# Patient Record
Sex: Female | Born: 1960 | Race: White | Hispanic: No | Marital: Married | Smoking: Never smoker
Health system: Southern US, Community
[De-identification: ages and names within clinical notes are randomized; demographics above are authoritative.]

## PROBLEM LIST (undated history)

## (undated) DIAGNOSIS — C801 Malignant (primary) neoplasm, unspecified: Secondary | ICD-10-CM

## (undated) DIAGNOSIS — N946 Dysmenorrhea, unspecified: Secondary | ICD-10-CM

## (undated) DIAGNOSIS — M722 Plantar fascial fibromatosis: Secondary | ICD-10-CM

## (undated) DIAGNOSIS — K219 Gastro-esophageal reflux disease without esophagitis: Secondary | ICD-10-CM

## (undated) DIAGNOSIS — K589 Irritable bowel syndrome without diarrhea: Secondary | ICD-10-CM

## (undated) DIAGNOSIS — IMO0002 Reserved for concepts with insufficient information to code with codable children: Secondary | ICD-10-CM

## (undated) DIAGNOSIS — M25569 Pain in unspecified knee: Secondary | ICD-10-CM

## (undated) DIAGNOSIS — E669 Obesity, unspecified: Secondary | ICD-10-CM

## (undated) DIAGNOSIS — Z8669 Personal history of other diseases of the nervous system and sense organs: Secondary | ICD-10-CM

## (undated) DIAGNOSIS — E7800 Pure hypercholesterolemia, unspecified: Secondary | ICD-10-CM

## (undated) DIAGNOSIS — J019 Acute sinusitis, unspecified: Secondary | ICD-10-CM

## (undated) DIAGNOSIS — L851 Acquired keratosis [keratoderma] palmaris et plantaris: Secondary | ICD-10-CM

## (undated) DIAGNOSIS — E785 Hyperlipidemia, unspecified: Secondary | ICD-10-CM

## (undated) HISTORY — DX: Pain in unspecified knee: M25.569

## (undated) HISTORY — DX: Obesity, unspecified: E66.9

## (undated) HISTORY — DX: Acquired keratosis (keratoderma) palmaris et plantaris: L85.1

## (undated) HISTORY — PX: COLONOSCOPY: SHX174

## (undated) HISTORY — DX: Pure hypercholesterolemia, unspecified: E78.00

## (undated) HISTORY — DX: Personal history of other diseases of the nervous system and sense organs: Z86.69

## (undated) HISTORY — DX: Plantar fascial fibromatosis: M72.2

## (undated) HISTORY — DX: Hyperlipidemia, unspecified: E78.5

## (undated) HISTORY — DX: Gastro-esophageal reflux disease without esophagitis: K21.9

## (undated) HISTORY — DX: Reserved for concepts with insufficient information to code with codable children: IMO0002

## (undated) HISTORY — DX: Dysmenorrhea, unspecified: N94.6

## (undated) HISTORY — DX: Irritable bowel syndrome, unspecified: K58.9

## (undated) HISTORY — DX: Acute sinusitis, unspecified: J01.90

---

## 1999-12-01 ENCOUNTER — Encounter: Admission: RE | Admit: 1999-12-01 | Discharge: 1999-12-02 | Payer: Self-pay | Admitting: Family Medicine

## 1999-12-28 ENCOUNTER — Other Ambulatory Visit: Admission: RE | Admit: 1999-12-28 | Discharge: 1999-12-28 | Payer: Self-pay | Admitting: Obstetrics and Gynecology

## 2001-02-08 ENCOUNTER — Other Ambulatory Visit: Admission: RE | Admit: 2001-02-08 | Discharge: 2001-02-08 | Payer: Self-pay | Admitting: Obstetrics and Gynecology

## 2002-02-15 ENCOUNTER — Other Ambulatory Visit: Admission: RE | Admit: 2002-02-15 | Discharge: 2002-02-15 | Payer: Self-pay | Admitting: Obstetrics and Gynecology

## 2003-03-15 ENCOUNTER — Other Ambulatory Visit: Admission: RE | Admit: 2003-03-15 | Discharge: 2003-03-15 | Payer: Self-pay | Admitting: Obstetrics and Gynecology

## 2004-05-18 ENCOUNTER — Other Ambulatory Visit: Admission: RE | Admit: 2004-05-18 | Discharge: 2004-05-18 | Payer: Self-pay | Admitting: Obstetrics and Gynecology

## 2006-03-23 ENCOUNTER — Ambulatory Visit: Payer: Self-pay | Admitting: Family Medicine

## 2006-04-18 ENCOUNTER — Other Ambulatory Visit: Admission: RE | Admit: 2006-04-18 | Discharge: 2006-04-18 | Payer: Self-pay | Admitting: Family Medicine

## 2006-04-18 ENCOUNTER — Ambulatory Visit: Payer: Self-pay | Admitting: Family Medicine

## 2006-04-18 ENCOUNTER — Encounter: Payer: Self-pay | Admitting: Family Medicine

## 2006-04-18 LAB — CONVERTED CEMR LAB: Pap Smear: NORMAL

## 2006-06-15 ENCOUNTER — Ambulatory Visit: Payer: Self-pay | Admitting: Family Medicine

## 2006-07-28 ENCOUNTER — Ambulatory Visit: Payer: Self-pay | Admitting: Internal Medicine

## 2006-10-13 ENCOUNTER — Encounter: Payer: Self-pay | Admitting: Family Medicine

## 2006-10-13 DIAGNOSIS — K219 Gastro-esophageal reflux disease without esophagitis: Secondary | ICD-10-CM

## 2006-10-13 DIAGNOSIS — J309 Allergic rhinitis, unspecified: Secondary | ICD-10-CM | POA: Insufficient documentation

## 2006-10-13 DIAGNOSIS — N946 Dysmenorrhea, unspecified: Secondary | ICD-10-CM

## 2006-10-13 DIAGNOSIS — Z87898 Personal history of other specified conditions: Secondary | ICD-10-CM

## 2006-10-13 DIAGNOSIS — K589 Irritable bowel syndrome without diarrhea: Secondary | ICD-10-CM

## 2006-10-13 DIAGNOSIS — E7800 Pure hypercholesterolemia, unspecified: Secondary | ICD-10-CM

## 2006-10-14 ENCOUNTER — Ambulatory Visit: Payer: Self-pay | Admitting: Family Medicine

## 2006-11-11 ENCOUNTER — Telehealth (INDEPENDENT_AMBULATORY_CARE_PROVIDER_SITE_OTHER): Payer: Self-pay | Admitting: *Deleted

## 2006-11-16 ENCOUNTER — Encounter: Payer: Self-pay | Admitting: Family Medicine

## 2007-05-25 ENCOUNTER — Encounter: Payer: Self-pay | Admitting: Family Medicine

## 2007-05-25 ENCOUNTER — Other Ambulatory Visit: Admission: RE | Admit: 2007-05-25 | Discharge: 2007-05-25 | Payer: Self-pay | Admitting: Family Medicine

## 2007-05-25 ENCOUNTER — Ambulatory Visit: Payer: Self-pay | Admitting: Family Medicine

## 2007-05-25 DIAGNOSIS — L851 Acquired keratosis [keratoderma] palmaris et plantaris: Secondary | ICD-10-CM | POA: Insufficient documentation

## 2007-05-29 LAB — CONVERTED CEMR LAB
Albumin: 3.9 g/dL (ref 3.5–5.2)
Basophils Absolute: 0 10*3/uL (ref 0.0–0.1)
Cholesterol: 216 mg/dL (ref 0–200)
Creatinine, Ser: 0.9 mg/dL (ref 0.4–1.2)
Direct LDL: 155.8 mg/dL
GFR calc Af Amer: 87 mL/min
HCT: 39.4 % (ref 36.0–46.0)
Hemoglobin: 13.9 g/dL (ref 12.0–15.0)
Lymphocytes Relative: 37.5 % (ref 12.0–46.0)
MCHC: 35.2 g/dL (ref 30.0–36.0)
MCV: 95.5 fL (ref 78.0–100.0)
Monocytes Absolute: 0.5 10*3/uL (ref 0.2–0.7)
Monocytes Relative: 6.1 % (ref 3.0–11.0)
Neutro Abs: 4.3 10*3/uL (ref 1.4–7.7)
Neutrophils Relative %: 55.5 % (ref 43.0–77.0)
Potassium: 4.3 meq/L (ref 3.5–5.1)
RDW: 12.3 % (ref 11.5–14.6)
Sodium: 137 meq/L (ref 135–145)
TSH: 1.9 microintl units/mL (ref 0.35–5.50)
Total Bilirubin: 1 mg/dL (ref 0.3–1.2)
Total CHOL/HDL Ratio: 4.6
Total Protein: 7 g/dL (ref 6.0–8.3)

## 2007-05-31 ENCOUNTER — Encounter (INDEPENDENT_AMBULATORY_CARE_PROVIDER_SITE_OTHER): Payer: Self-pay | Admitting: *Deleted

## 2007-05-31 LAB — CONVERTED CEMR LAB: Pap Smear: NORMAL

## 2007-06-06 ENCOUNTER — Telehealth (INDEPENDENT_AMBULATORY_CARE_PROVIDER_SITE_OTHER): Payer: Self-pay | Admitting: *Deleted

## 2007-06-07 ENCOUNTER — Encounter: Payer: Self-pay | Admitting: Family Medicine

## 2007-06-23 ENCOUNTER — Encounter: Payer: Self-pay | Admitting: Family Medicine

## 2007-07-31 ENCOUNTER — Encounter: Payer: Self-pay | Admitting: Family Medicine

## 2007-08-28 ENCOUNTER — Ambulatory Visit: Payer: Self-pay | Admitting: Family Medicine

## 2007-08-29 LAB — CONVERTED CEMR LAB
ALT: 18 units/L (ref 0–35)
VLDL: 30 mg/dL (ref 0–40)

## 2007-12-28 ENCOUNTER — Ambulatory Visit: Payer: Self-pay | Admitting: Family Medicine

## 2007-12-28 DIAGNOSIS — R079 Chest pain, unspecified: Secondary | ICD-10-CM

## 2008-02-01 ENCOUNTER — Encounter: Payer: Self-pay | Admitting: Family Medicine

## 2008-03-04 ENCOUNTER — Ambulatory Visit: Payer: Self-pay | Admitting: Unknown Physician Specialty

## 2008-03-04 ENCOUNTER — Encounter: Payer: Self-pay | Admitting: Family Medicine

## 2008-03-12 ENCOUNTER — Encounter: Payer: Self-pay | Admitting: Family Medicine

## 2008-03-25 ENCOUNTER — Telehealth: Payer: Self-pay | Admitting: Family Medicine

## 2008-03-26 ENCOUNTER — Telehealth: Payer: Self-pay | Admitting: Family Medicine

## 2008-04-22 ENCOUNTER — Telehealth: Payer: Self-pay | Admitting: Family Medicine

## 2008-04-23 ENCOUNTER — Telehealth (INDEPENDENT_AMBULATORY_CARE_PROVIDER_SITE_OTHER): Payer: Self-pay | Admitting: *Deleted

## 2008-04-23 ENCOUNTER — Ambulatory Visit: Payer: Self-pay | Admitting: Family Medicine

## 2008-05-28 ENCOUNTER — Ambulatory Visit: Payer: Self-pay | Admitting: Family Medicine

## 2008-05-28 ENCOUNTER — Other Ambulatory Visit: Admission: RE | Admit: 2008-05-28 | Discharge: 2008-05-28 | Payer: Self-pay | Admitting: Family Medicine

## 2008-05-28 ENCOUNTER — Encounter: Payer: Self-pay | Admitting: Family Medicine

## 2008-05-28 LAB — HM PAP SMEAR

## 2008-05-29 LAB — CONVERTED CEMR LAB
Basophils Absolute: 0 10*3/uL (ref 0.0–0.1)
Bilirubin, Direct: 0.1 mg/dL (ref 0.0–0.3)
Calcium: 8.8 mg/dL (ref 8.4–10.5)
Cholesterol: 161 mg/dL (ref 0–200)
Eosinophils Absolute: 0.1 10*3/uL (ref 0.0–0.7)
GFR calc Af Amer: 99 mL/min
Glucose, Bld: 90 mg/dL (ref 70–99)
HCT: 40.4 % (ref 36.0–46.0)
Hemoglobin: 13.9 g/dL (ref 12.0–15.0)
MCHC: 34.5 g/dL (ref 30.0–36.0)
MCV: 98 fL (ref 78.0–100.0)
Monocytes Absolute: 0.5 10*3/uL (ref 0.1–1.0)
Neutro Abs: 5.5 10*3/uL (ref 1.4–7.7)
RDW: 12.2 % (ref 11.5–14.6)
Sodium: 141 meq/L (ref 135–145)
Total Bilirubin: 0.5 mg/dL (ref 0.3–1.2)
Total Protein: 6.9 g/dL (ref 6.0–8.3)
Triglycerides: 127 mg/dL (ref 0–149)

## 2008-07-31 ENCOUNTER — Encounter: Payer: Self-pay | Admitting: Family Medicine

## 2008-08-08 ENCOUNTER — Encounter: Payer: Self-pay | Admitting: Family Medicine

## 2009-04-14 ENCOUNTER — Encounter (INDEPENDENT_AMBULATORY_CARE_PROVIDER_SITE_OTHER): Payer: Self-pay | Admitting: *Deleted

## 2009-06-17 ENCOUNTER — Ambulatory Visit: Payer: Self-pay | Admitting: Family Medicine

## 2009-06-17 DIAGNOSIS — M25569 Pain in unspecified knee: Secondary | ICD-10-CM

## 2009-06-20 ENCOUNTER — Encounter: Payer: Self-pay | Admitting: Family Medicine

## 2009-06-20 ENCOUNTER — Encounter (INDEPENDENT_AMBULATORY_CARE_PROVIDER_SITE_OTHER): Payer: Self-pay | Admitting: *Deleted

## 2009-06-20 LAB — CONVERTED CEMR LAB
Alkaline Phosphatase: 41 units/L (ref 39–117)
Basophils Absolute: 0 10*3/uL (ref 0.0–0.1)
Basophils Relative: 0.7 % (ref 0.0–3.0)
Bilirubin, Direct: 0 mg/dL (ref 0.0–0.3)
CO2: 28 meq/L (ref 19–32)
Calcium: 9.1 mg/dL (ref 8.4–10.5)
Cholesterol: 205 mg/dL — ABNORMAL HIGH (ref 0–200)
Creatinine, Ser: 0.7 mg/dL (ref 0.4–1.2)
Eosinophils Absolute: 0.1 10*3/uL (ref 0.0–0.7)
Lymphocytes Relative: 41.1 % (ref 12.0–46.0)
MCHC: 33.5 g/dL (ref 30.0–36.0)
Neutrophils Relative %: 51.3 % (ref 43.0–77.0)
Platelets: 338 10*3/uL (ref 150.0–400.0)
RBC: 4.1 M/uL (ref 3.87–5.11)
RDW: 12.7 % (ref 11.5–14.6)
Total Bilirubin: 0.9 mg/dL (ref 0.3–1.2)
Total CHOL/HDL Ratio: 4
Triglycerides: 230 mg/dL — ABNORMAL HIGH (ref 0.0–149.0)

## 2009-08-01 ENCOUNTER — Encounter: Payer: Self-pay | Admitting: Family Medicine

## 2009-08-06 ENCOUNTER — Encounter (INDEPENDENT_AMBULATORY_CARE_PROVIDER_SITE_OTHER): Payer: Self-pay | Admitting: *Deleted

## 2009-08-28 ENCOUNTER — Ambulatory Visit: Payer: Self-pay | Admitting: Family Medicine

## 2009-08-28 DIAGNOSIS — M722 Plantar fascial fibromatosis: Secondary | ICD-10-CM

## 2009-09-19 ENCOUNTER — Ambulatory Visit: Payer: Self-pay | Admitting: Family Medicine

## 2009-09-30 ENCOUNTER — Encounter: Payer: Self-pay | Admitting: Family Medicine

## 2009-09-30 LAB — CONVERTED CEMR LAB
Cholesterol: 207 mg/dL — ABNORMAL HIGH (ref 0–200)
Total CHOL/HDL Ratio: 4
Triglycerides: 134 mg/dL (ref 0.0–149.0)
VLDL: 26.8 mg/dL (ref 0.0–40.0)

## 2009-11-07 ENCOUNTER — Encounter (INDEPENDENT_AMBULATORY_CARE_PROVIDER_SITE_OTHER): Payer: Self-pay | Admitting: *Deleted

## 2009-11-24 ENCOUNTER — Ambulatory Visit: Payer: Self-pay | Admitting: Family Medicine

## 2009-11-24 DIAGNOSIS — J019 Acute sinusitis, unspecified: Secondary | ICD-10-CM

## 2010-07-14 NOTE — Letter (Signed)
Summary: Results Follow up Letter  [place] at [facility]  [address] [place], [place] [address]   Phone: [phone]  Fax: [phone]    08/06/2009 MRN: [id number]    [facility] [address] [place], [place]  [address]    Dear Ms. Thamas Jaegers,  The following are the results of your recent test(s):  Test         Result    Pap Smear:        Normal _____  Not Normal _____ Comments: ______________________________________________________ Cholesterol: LDL(Bad cholesterol):         Your goal is less than:         HDL (Good cholesterol):       Your goal is more than: Comments:  ______________________________________________________ Mammogram:        Normal __X___  Not Normal _____ Comments:  Yearly follow up is recommended.   ___________________________________________________________________ Hemoccult:        Normal _____  Not normal _______ Comments:    _____________________________________________________________________ Other Tests:    We routinely do not discuss normal results over the telephone.  If you desire a copy of the results, or you have any questions about this information we can discuss them at your next office visit.   Sincerely,    Marne A. Milinda Antis, M.D.  MAT:lsf

## 2010-07-14 NOTE — Assessment & Plan Note (Signed)
Summary: HEELS ON FEET AND KNEES/DLO   Vital Signs:  Patient profile:   50 year old female Height:      66.75 inches Weight:      181.8 pounds BMI:     28.79 Temp:     98.2 degrees F oral Pulse rate:   72 / minute Pulse rhythm:   regular BP sitting:   130 / 78  (left arm) Cuff size:   regular  Vitals Entered By: Benny Lennert CMA Duncan Dull) (August 28, 2009 10:50 AM)  History of Present Illness: Chief complaint heels on feet and knees Left worse then right center of heel worse ober last month had x-ray on knees and still in pain  50 year old with B knee pain and B heel pain:  Has some longstanding back issues, had a series of epidural injections in her back and sitting here and running into her feet and heel.  Has a known herniated disk.   B knees: started around six months ago, having some crepitus. Unable to move them that well - feels better with holding tight. Burns and hurtin. Pain going p and down sairs. Some catching.   Heels: started a couple of months ago. About a year ago bought some shapeup shoes. Hurts with walking.  Pain in morning and after rising from a seated position.  Allergies: 1)  ! Nsaids 2)  ! Prilosec 3)  ! * Allegra  Past History:  Past medical, surgical, family and social histories (including risk factors) reviewed, and no changes noted (except as noted below).  Past Medical History: Reviewed history from 05/28/2008 and no changes required. Allergic rhinitis GERD/esophagitis  IBS hyperlipidemia obesity  GI -- Dr Markham Jordan   Past Surgical History: Reviewed history from 03/12/2008 and no changes required. EGD - 9/09 gastritis, grade one esophagitis  colonoscopy 9/09 nl   Family History: Reviewed history from 05/28/2008 and no changes required. mother MI, HTN- died at 53 father lung cancer- smoker, esoph stricture (?GERD) brother HTN sister died in 11/18/22 MAC (lung disease)  Social History: Reviewed history from 05/28/2008 and no changes  required. Never Smoked no alcohol   Review of Systems       REVIEW OF SYSTEMS  GEN: No systemic complaints, no fevers, chills, sweats, or other acute illnesses MSK: Detailed in the HPI GI: tolerating PO intake without difficulty Otherwise the pertinent positives of the ROS are noted above.    Physical Exam  General:  GEN: Well-developed,well-nourished,in no acute distress; alert,appropriate and cooperative throughout examination HEENT: Normocephalic and atraumatic without obvious abnormalities. No apparent alopecia or balding. Ears, externally no deformities PULM: Breathing comfortably in no respiratory distress EXT: No clubbing, cyanosis, or edema PSYCH: Normally interactive. Cooperative during the interview. Pleasant. Friendly and conversant. Not anxious or depressed appearing. Normal, full affect.  Msk:  Gait: Normal heel toe pattern ROM: WNL Effusion: neg Echymosis or edema: none Patellar tendon NT Painful PLICA: neg Grind: pain with inferior and superior polar compression Medial and lateral patellar facet loading: painful NT medial and lateral joint lines Mcmurray's neg Flexion-pinch neg Varus and valgus stress: stable Lachman: neg Ant and Post drawer: neg Hip abduction, IR, ER: WNL Hip flexion str: 5/5 Hip abd: 5/5 Quad: 5/5 VMO atrophy: mild Hamstring concentric and eccentric: 5/5   Additional Exam:  B feet Echymosis: no Edema: no ROM: full LE B Gait: heel toe, non-antalgic MT pain: no Callus pattern: none Lateral Mall: NT Medial Mall: NT Talus: NT Navicular: NT Calcaneous: NT Metatarsals: NT  5th MT: NT Phalanges: NT Achilles: NT Plantar Fascia: tender, medial along PF. Pain with forced dorsi Fat Pad: NT Peroneals: NT Post Tib: NT Great Toe: Nml motion Ant Drawer: neg Sensation: intact    Impression & Recommendations:  Problem # 1:  PATELLO-FEMORAL SYNDROME (ICD-719.46) Assessment New Patient instructed regarding patellofemoral pain  syndrome. Reviewed a strengthening program for quadriceps including squats, mini-squats, lunges, leg lifts 3 ways, as well as hip abductor strengthening with lifts of leg lying on hip. Reviewed several proprioceptive exerices for the knee.  I think this is all PF joint. Classic presentation and exam.  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 Reviewed footwear. Rigid soles have been shown to help with PF. Night splints can help. Reviewed rehab of stretching and calf raises.  The patient would be an ideal candidate for custom orthotics, which were shown in a 2008 Cochrane Review to be beneficial in PF -  OTC orthotics for now Could benefit from a corticosteroid injection if conservative treatment fails.  Heel cups  Complete Medication List: 1)  Necon 1/35 (28) 1-35 Mg-mcg Tabs (Norethindrone-eth estradiol) .... Take one by mouth daily as directed continuously  Patient Instructions: 1)  Please read handouts from AAOSM and American Academy of Foot [employer] Ankle Surgeons on Plantar Fascitis. 2)  STRETCHING and Strengthening program critically important. 3)  Strengthening on foot and calf muscles as seen in handout. 4)  Calf raises, 2 legged, then 1 legged. 5)  Foot massage with tennis ball. 6)  Ice massage. 7)  NEEDS TO BE DONE EVERY DAY 8)  Recommended over the counter insoles. (Spenco or Hapad) 9)  A rigid shoe with good arch support helps: Dansko (great), Randel Pigg, Merrell 10)  No easily bendable shoes.   11)  Patellofemoral Syndrome Rehab 12)  Isometric contractions of thigh - 10 x 10 secs 13)  3 way straight leg raises - build to 3 sets of 30 and then add weights 14)  begin with no weight. When 3 x 30 reached, Add 2 lb. ankle weight. Increase to 3,4,5,6 when 3x30 achieved. 15)  Drop squats - limit to 45 deg, 3x15 16)  Modified lunge - running position, 3x15 17)  Seated quad extensions,  3x15, add ankle weights 18)  Step downs, 3x15 with body weight slowly on downward phase 19)  Standing cone touches, 3x/day each leg   Current Allergies (reviewed today): ! NSAIDS ! PRILOSEC ! Joyce Copa

## 2010-07-14 NOTE — Letter (Signed)
Summary: [place] No Show Letter  [place] at [facility]  [address] [place], [place] [address]   Phone: [phone]  Fax: [phone]    11/07/2009 MRN: [id number]  ROSALIA MCAVOY [address] [place], [place]  [address]   Dear Ms. Thamas Jaegers,   Our records indicate that you missed your scheduled appointment with ___lab__________________ on __5.27.11__________.  Please contact this office to reschedule your appointment as soon as possible.  It is important that you keep your scheduled appointments with your physician, so we can provide you the best care possible.  Please be advised that there may be a charge for "no show" appointments.    Sincerely,   [place] at [facility]

## 2010-07-14 NOTE — Assessment & Plan Note (Signed)
Summary: ST,EARS,CHEST CONGESTION/CLE   Vital Signs:  Patient profile:   50 year old female Height:      66.75 inches Weight:      183.50 pounds BMI:     29.06 O2 Sat:      97 % on Room air Temp:     98.7 degrees F oral Pulse rate:   88 / minute Pulse rhythm:   regular Resp:     20 per minute BP sitting:   124 / 76  (left arm) Cuff size:   regular  Vitals Entered By: Lewanda Rife LPN (November 24, 2009 2:12 PM)  O2 Flow:  Room air CC: head and chest congestion, dizzy, h/a, very sorethroat with trouble swallowing, both ears ache and dry cough   History of Present Illness: thursday started having symptoms after a vacation  got worse over weekend  ears hurt and throat and head  chest is sore lots of coughing some hoarseness not much phlegm- but hurts to cough  no sick contacts   ? if it started with allergies  sinuses hurt a lot  mucous from nose is mainly clear  ? fever -- sometimes feels hot- no chills or aches   rapid strep test neg today  Allergies: 1)  ! Nsaids 2)  ! Prilosec 3)  ! Joyce Copa  Past History:  Past Medical History: Last updated: 06/10/2008 Allergic rhinitis GERD/esophagitis  IBS hyperlipidemia obesity  GI -- Dr Markham Jordan   Past Surgical History: Last updated: 03/12/2008 EGD - 9/09 gastritis, grade one esophagitis  colonoscopy 9/09 nl   Family History: Last updated: 10-Jun-2008 mother MI, HTN- died at 45 father lung cancer- smoker, esoph stricture (?GERD) brother HTN sister died in 11-03-2022 MAC (lung disease)  Social History: Last updated: June 10, 2008 Never Smoked no alcohol   Risk Factors: Smoking Status: never (05/25/2007)  Review of Systems General:  Denies fatigue, malaise, and sweats. Eyes:  Denies blurring, eye pain, and itching. ENT:  Complains of hoarseness, postnasal drainage, sinus pressure, and sore throat. CV:  Denies chest pain or discomfort, lightheadness, and palpitations. Resp:  Complains of cough and sputum productive;  denies shortness of breath. GI:  Denies abdominal pain, indigestion, and nausea. Derm:  Denies itching, lesion(s), poor wound healing, and rash. Neuro:  Complains of headaches.  Physical Exam  General:  fatigued appearing Head:  normocephalic, atraumatic, and no abnormalities observed.  frontal and maxillary sinus tenderness bilat  Eyes:  vision grossly intact, pupils equal, pupils round, pupils reactive to light, and no injection.   Ears:  R ear normal.  L TM with large effusion but not erythema  Nose:  nares are injected and congested bilaterally  Mouth:  pharynx pink and moist, no erythema, and no exudates.  much clear post nasal drip Neck:  supple with full rom and no masses or thyromegally, no JVD or carotid bruit  Chest Wall:  No deformities, masses, or tenderness noted. Lungs:  Normal respiratory effort, chest expands symmetrically. Lungs are clear to auscultation, no crackles or wheezes. Heart:  Normal rate and regular rhythm. S1 and S2 normal without gallop, murmur, click, rub or other extra sounds. Skin:  Intact without suspicious lesions or rashes Cervical Nodes:  No lymphadenopathy noted Psych:  normal affect, talkative and pleasant    Impression & Recommendations:  Problem # 1:  SINUSITIS - ACUTE-NOS (ICD-461.9) Assessment New with uri/ congestion and ETD will cover with amox liquid - pt cannot take pills  guifen- cod cough syrup with caution  recommend sympt care- see pt instructions  pt advised to update me if symptoms worsen or do not improve  The following medications were removed from the medication list:    Robitussin Dm 100-10 Mg/75ml Syrp (Dextromethorphan-guaifenesin) ..... Otc as directed. Her updated medication list for this problem includes:    Alka-seltzer Plus Cold 2-7.8-325 Mg Tbef (Chlorphen-phenyleph-asa) ..... Otc as directed.    Amoxicillin 250 Mg/55ml Susr (Amoxicillin) .Marland Kitchen... 2 teaspoons by mouth three times a day for 10 days    Guaiatussin Ac 100-10  Mg/4ml Syrp (Guaifenesin-codeine) .Marland Kitchen... 1-2 teaspoons by mouth up to every 4-6 hours as needed cough may sedate  Complete Medication List: 1)  Necon 1/35 (28) 1-35 Mg-mcg Tabs (Norethindrone-eth estradiol) .... Take one by mouth daily as directed continuously 2)  Vytorin 10-20 Mg Tabs (Ezetimibe-simvastatin) .... Take 1 tablet by mouth once a day 3)  Alka-seltzer Plus Cold 2-7.8-325 Mg Tbef (Chlorphen-phenyleph-asa) .... Otc as directed. 4)  Amoxicillin 250 Mg/49ml Susr (Amoxicillin) .... 2 teaspoons by mouth three times a day for 10 days 5)  Guaiatussin Ac 100-10 Mg/24ml Syrp (Guaifenesin-codeine) .Marland Kitchen.. 1-2 teaspoons by mouth up to every 4-6 hours as needed cough may sedate  Patient Instructions: 1)  drink lots of fluids and get rest  2)  tylenol for fever or pain and sore throat  3)  use cough medicine with caution  4)  take amoxicillin as directed  5)  update next week if not starting to feel better or if worse  Prescriptions: GUAIATUSSIN AC 100-10 MG/5ML SYRP (GUAIFENESIN-CODEINE) 1-2 teaspoons by mouth up to every 4-6 hours as needed cough may sedate  #120cc x 0   Entered and Authorized by:   Judith Part MD   Signed by:   Judith Part MD on 11/24/2009   Method used:   Print then Give to Patient   RxID:   [id number] AMOXICILLIN 250 MG/5ML SUSR (AMOXICILLIN) 2 teaspoons by mouth three times a day for 10 days  #10 days x 0   Entered and Authorized by:   Judith Part MD   Signed by:   Judith Part MD on 11/24/2009   Method used:   Electronically to        [facility]. [address] [phone]* (retail)       [address] [place], [place]  [id number]       Ph: [phone]       Fax: [phone]   RxID:   [id number]   Current Allergies (reviewed today): ! NSAIDS ! PRILOSEC ! Joyce Copa  Laboratory Results  Date/Time Received: November 24, 2009 2:14 PM  Date/Time Reported: November 24, 2009 2:14 PM   Other Tests  Rapid Strep: negative  Kit Test Internal QC:  Positive   (Normal Range: Negative)

## 2010-07-14 NOTE — Miscellaneous (Signed)
   Clinical Lists Changes  Observations: Added new observation of MAMMO DUE: 02.18.2012 (08/01/2009 15:07) Added new observation of MAMMOGRAM: Normal (08/01/2009 15:07)

## 2010-07-14 NOTE — Assessment & Plan Note (Signed)
Summary: CPX PLUS PAP SMEAR / LFW   Vital Signs:  Patient profile:   50 year old female Height:      66.75 inches Weight:      178 pounds BMI:     28.19 Temp:     98.2 degrees F oral Pulse rate:   68 / minute Pulse rhythm:   regular BP sitting:   110 / 82  (left arm) Cuff size:   regular  Vitals Entered By: Lowella Petties CMA (June 17, 2009 9:42 AM) CC: 30 minute check up   History of Present Illness: here for health mt exam   wt is down 15 lb today had lost over 20 lb -- and gained about 5 lb back over the holidays  tried new diet -- "hcg" -- with eating less and walking ( this not as much in the winter)    bp good at 110/82  colonosc nl in 9/09  pap nl 12/09 no gyn symptoms or problems  periods are not to horrible -- does withdrawl bleed twice yearly -- and not heavy of painful on necon   mam 2/10 nl  no changes on self exam   Td 07 got flu shot in sept   due for lipid check on vytorin today  she stopped it because diet is much better  diet -- is very good   knees ache on the inside  some stiffness after inactivity     Allergies: 1)  ! Nsaids 2)  ! Prilosec 3)  ! Joyce Copa  Past History:  Past Medical History: Last updated: 06-02-2008 Allergic rhinitis GERD/esophagitis  IBS hyperlipidemia obesity  GI -- Dr Markham Jordan   Past Surgical History: Last updated: 03/12/2008 EGD - 9/09 gastritis, grade one esophagitis  colonoscopy 9/09 nl   Family History: Last updated: 06/02/2008 mother MI, HTN- died at 19 father lung cancer- smoker, esoph stricture (?GERD) brother HTN sister died in 2022-10-26 MAC (lung disease)  Social History: Last updated: 06-02-08 Never Smoked no alcohol   Risk Factors: Smoking Status: never (05/25/2007)  Review of Systems General:  Denies fatigue, fever, loss of appetite, and malaise. Eyes:  Denies blurring and eye pain. CV:  Denies chest pain or discomfort, palpitations, and shortness of breath with exertion. Resp:   Denies cough and wheezing. GI:  Denies abdominal pain, bloody stools, and change in bowel habits. GU:  Denies abnormal vaginal bleeding, discharge, and dysuria. MS:  Complains of joint pain and stiffness; denies joint redness and joint swelling. Derm:  Denies lesion(s), poor wound healing, and rash. Neuro:  Denies numbness and tingling. Psych:  Denies anxiety and depression. Endo:  Denies cold intolerance, excessive thirst, excessive urination, and heat intolerance. Heme:  Denies abnormal bruising and bleeding.  Physical Exam  General:  overweight but generally well appearing wt loss noted Head:  normocephalic, atraumatic, and no abnormalities observed.   Eyes:  vision grossly intact, pupils equal, pupils round, and pupils reactive to light.  no conjunctival pallor, injection or icterus  Ears:  R ear normal and L ear normal.   Nose:  no nasal discharge.   Mouth:  pharynx pink and moist.   Neck:  supple with full rom and no masses or thyromegally, no JVD or carotid bruit  Chest Wall:  No deformities, masses, or tenderness noted. Lungs:  Normal respiratory effort, chest expands symmetrically. Lungs are clear to auscultation, no crackles or wheezes. Heart:  Normal rate and regular rhythm. S1 and S2 normal without gallop, murmur, click,  rub or other extra sounds. Abdomen:  Bowel sounds positive,abdomen soft and non-tender without masses, organomegaly or hernias noted. no renal bruits  Msk:  No deformity or scoliosis noted of thoracic or lumbar spine.   poor rom knees - some crepitice Pulses:  R and L carotid,radial,femoral,dorsalis pedis and posterior tibial pulses are full and equal bilaterally Extremities:  No clubbing, cyanosis, edema, or deformity noted with normal full range of motion of all joints.   Neurologic:  sensation intact to light touch, gait normal, and DTRs symmetrical and normal.   Skin:  Intact without suspicious lesions or rashes Cervical Nodes:  No lymphadenopathy  noted Axillary Nodes:  No palpable lymphadenopathy Inguinal Nodes:  No significant adenopathy Psych:  normal affect, talkative and pleasant    Impression & Recommendations:  Problem # 1:  HEALTH MAINTENANCE EXAM (ICD-V70.0) Assessment Comment Only reviewed health habits including diet, exercise and skin cancer prevention reviewed health maintenance list and family history commended on wt loss labs today Orders: Venipuncture ([address]) TLB-Lipid Panel (80061-LIPID) TLB-BMP (Basic Metabolic Panel-BMET) (80048-METABOL) TLB-CBC Platelet - w/Differential (85025-CBCD) TLB-Hepatic/Liver Function Pnl (80076-HEPATIC) TLB-TSH (Thyroid Stimulating Hormone) (84443-TSH)  Problem # 2:  KNEE PAIN (ICD-719.46) Assessment: New new bilat knee pain- worse since more walking some crepitice - ? if early OA check x rays and advise Orders: Radiology Referral (Radiology)  Problem # 3:  Hx of HYPERCHOLESTEROLEMIA (ICD-272.0) Assessment: Unchanged  off vytorin for now with good diet  lab today-- if up will re start vytorin rev low sat fat diet-- is doing very well  Her updated medication list for this problem includes:    Vytorin 10-20 Mg Tabs (Ezetimibe-simvastatin) .Marland Kitchen... Take one by mouth once daily  Orders: Venipuncture ([address]) TLB-Lipid Panel (80061-LIPID) TLB-BMP (Basic Metabolic Panel-BMET) (80048-METABOL) TLB-CBC Platelet - w/Differential (85025-CBCD) TLB-Hepatic/Liver Function Pnl (80076-HEPATIC) TLB-TSH (Thyroid Stimulating Hormone) (84443-TSH)  Labs Reviewed: SGOT: 19 (05/28/2008)   SGPT: 23 (05/28/2008)   HDL:49.0 (05/28/2008), 49.5 (08/28/2007)  LDL:87 (05/28/2008), 114 (09/81/1914)  Chol:161 (05/28/2008), 193 (08/28/2007)  Trig:127 (05/28/2008), 149 (08/28/2007)  Problem # 4:  Hx of DYSMENORRHEA (ICD-625.3) Assessment: Unchanged doing well on necon with 2 withdrawl bleeds per year  no signs of menopause yet  med refilled   Complete Medication List: 1)  Vytorin 10-20 Mg Tabs  (Ezetimibe-simvastatin) .... Take one by mouth once daily 2)  Necon 1/35 (28) 1-35 Mg-mcg Tabs (Norethindrone-eth estradiol) .... Take one by mouth daily as directed continuously  Patient Instructions: 1)  we will schedule knee x rays at check out  2)  we will schedule mammogram at check out  3)  labs today- will see how cholesterol is off med  4)  keep up the good diet and exercise  Prescriptions: NECON 1/35 (28) 1-35 MG-MCG TABS (NORETHINDRONE-ETH ESTRADIOL) take one by mouth daily as directed continuously  #3 months x 3   Entered and Authorized by:   Judith Part MD   Signed by:   Judith Part MD on 06/17/2009   Method used:   Print then Give to Patient   RxID:   [id number]   Prior Medications (reviewed today): VYTORIN 10-20 MG TABS (EZETIMIBE-SIMVASTATIN) Take one by mouth once daily Current Allergies: ! NSAIDS ! PRILOSEC ! Joyce Copa

## 2010-07-14 NOTE — Miscellaneous (Signed)
Summary: med list update   Clinical Lists Changes  Medications: Removed medication of VYTORIN 10-20 MG TABS (EZETIMIBE-SIMVASTATIN) Take one by mouth once daily     Prior Medications: NECON 1/35 (28) 1-35 MG-MCG TABS (NORETHINDRONE-ETH ESTRADIOL) take one by mouth daily as directed continuously Current Allergies: ! NSAIDS ! PRILOSEC ! Joyce Copa

## 2010-07-14 NOTE — Miscellaneous (Signed)
Summary: Vytorin 10-20 update med list  Medications Added VYTORIN 10-20 MG TABS (EZETIMIBE-SIMVASTATIN) Take 1 tablet by mouth once a day       Clinical Lists Changes  Medications: Added new medication of VYTORIN 10-20 MG TABS (EZETIMIBE-SIMVASTATIN) Take 1 tablet by mouth once a day     Current Allergies: ! NSAIDS ! PRILOSEC ! Joyce Copa

## 2010-11-05 ENCOUNTER — Other Ambulatory Visit: Payer: Self-pay | Admitting: Family Medicine

## 2010-11-05 NOTE — Telephone Encounter (Signed)
Pt already has CPX scheduled 01/12/11.

## 2010-11-19 ENCOUNTER — Encounter: Payer: Self-pay | Admitting: Family Medicine

## 2010-11-20 ENCOUNTER — Encounter: Payer: Self-pay | Admitting: Family Medicine

## 2010-11-20 ENCOUNTER — Ambulatory Visit (INDEPENDENT_AMBULATORY_CARE_PROVIDER_SITE_OTHER): Payer: 59 | Admitting: Family Medicine

## 2010-11-20 DIAGNOSIS — M5430 Sciatica, unspecified side: Secondary | ICD-10-CM | POA: Insufficient documentation

## 2010-11-20 DIAGNOSIS — M542 Cervicalgia: Secondary | ICD-10-CM | POA: Insufficient documentation

## 2010-11-20 MED ORDER — HYDROCODONE-ACETAMINOPHEN 5-500 MG PO TABS: 1.0000 | ORAL_TABLET | Freq: Four times a day (QID) | ORAL | Status: DC | PRN

## 2010-11-20 MED ORDER — CYCLOBENZAPRINE HCL 10 MG PO TABS: 5.0000 mg | ORAL_TABLET | Freq: Three times a day (TID) | ORAL | Status: DC | PRN

## 2010-11-20 NOTE — Patient Instructions (Signed)
Use the flexeril and vicodin for pain.  Both can make you drowsy. Gently stretch your back per the instructions from physical therapy.  If not improved, let us know so we can set you up with the back clinic.  Take care.

## 2010-11-20 NOTE — Progress Notes (Signed)
H/o lower back pain.  Worse recently; over last 6 months.  Pain with bending over and walking.  Pain radiates down into L leg below the knee. L leg occ numb. No weakness in foot or leg.  No R sided sx.  No bowel/bladder dysfunction.  No known trigger with this episode.    Pain on L side of neck also, more on L than right. Gradually worse.  No radiation.  Taking aleve for the pain with minimal change.    No FCNAV, no ear pain.    Meds, vitals, and allergies reviewed.   ROS: See HPI.  Otherwise, noncontributory.  nad ncat Mmm Neck supple  No midline pain but L paraspinal muscles and L trap ttp Normal rom at the L shoulder rrr ctab abd benign Ext w/o edema L SLR positive but distally NV intact.

## 2010-11-22 ENCOUNTER — Encounter: Payer: Self-pay | Admitting: Family Medicine

## 2010-11-22 NOTE — Assessment & Plan Note (Signed)
D/w pt about benign exam and no need for imaging now.  i would use routine back stretches and pain meds as instructed with fu prn.  She agrees/understands.

## 2010-11-22 NOTE — Assessment & Plan Note (Signed)
D/w pt about benign exam and no need for imaging now.  i would use routine neck stretches and pain meds as instructed with fu prn.  She agrees/understands.

## 2011-01-03 ENCOUNTER — Telehealth: Payer: Self-pay | Admitting: Family Medicine

## 2011-01-03 DIAGNOSIS — E7800 Pure hypercholesterolemia, unspecified: Secondary | ICD-10-CM

## 2011-01-03 DIAGNOSIS — Z0000 Encounter for general adult medical examination without abnormal findings: Secondary | ICD-10-CM | POA: Insufficient documentation

## 2011-01-03 NOTE — Telephone Encounter (Signed)
Message copied by Judy Pimple on Sun Jan 03, 2011  2:14 PM ------      Message from: Baldomero Lamy      Created: Fri Jan 01, 2011 11:28 AM      Regarding: cpx labs thurs       Please order  future cpx labs for pt's upcomming lab appt.      Thanks      Rodney Booze

## 2011-01-07 ENCOUNTER — Other Ambulatory Visit (INDEPENDENT_AMBULATORY_CARE_PROVIDER_SITE_OTHER): Payer: 59

## 2011-01-07 DIAGNOSIS — E7800 Pure hypercholesterolemia, unspecified: Secondary | ICD-10-CM

## 2011-01-07 DIAGNOSIS — Z0000 Encounter for general adult medical examination without abnormal findings: Secondary | ICD-10-CM

## 2011-01-07 LAB — COMPREHENSIVE METABOLIC PANEL
Albumin: 4.1 g/dL (ref 3.5–5.2)
BUN: 14 mg/dL (ref 6–23)
Calcium: 8.3 mg/dL — ABNORMAL LOW (ref 8.4–10.5)
Chloride: 104 mEq/L (ref 96–112)
Glucose, Bld: 85 mg/dL (ref 70–99)
Potassium: 4.3 mEq/L (ref 3.5–5.1)
Sodium: 137 mEq/L (ref 135–145)
Total Protein: 7.4 g/dL (ref 6.0–8.3)

## 2011-01-07 LAB — CBC WITH DIFFERENTIAL/PLATELET
Basophils Relative: 0.6 % (ref 0.0–3.0)
Eosinophils Relative: 0.8 % (ref 0.0–5.0)
Lymphocytes Relative: 47.8 % — ABNORMAL HIGH (ref 12.0–46.0)
MCV: 99.2 fl (ref 78.0–100.0)
Monocytes Relative: 5.2 % (ref 3.0–12.0)
Neutrophils Relative %: 45.6 % (ref 43.0–77.0)
RBC: 4.17 Mil/uL (ref 3.87–5.11)
WBC: 11.6 10*3/uL — ABNORMAL HIGH (ref 4.5–10.5)

## 2011-01-07 LAB — TSH: TSH: 1.91 u[IU]/mL (ref 0.35–5.50)

## 2011-01-07 LAB — LIPID PANEL
Cholesterol: 207 mg/dL — ABNORMAL HIGH (ref 0–200)
Total CHOL/HDL Ratio: 4

## 2011-01-12 ENCOUNTER — Other Ambulatory Visit (HOSPITAL_COMMUNITY)
Admission: RE | Admit: 2011-01-12 | Discharge: 2011-01-12 | Disposition: A | Discharge status: Home / Self Care | Payer: 59 | Source: Ambulatory Visit | Attending: Family Medicine | Admitting: Family Medicine

## 2011-01-12 ENCOUNTER — Ambulatory Visit (INDEPENDENT_AMBULATORY_CARE_PROVIDER_SITE_OTHER): Payer: 59 | Admitting: Family Medicine

## 2011-01-12 ENCOUNTER — Encounter: Payer: Self-pay | Admitting: Family Medicine

## 2011-01-12 DIAGNOSIS — R0789 Other chest pain: Secondary | ICD-10-CM | POA: Insufficient documentation

## 2011-01-12 DIAGNOSIS — K219 Gastro-esophageal reflux disease without esophagitis: Secondary | ICD-10-CM

## 2011-01-12 DIAGNOSIS — Z01419 Encounter for gynecological examination (general) (routine) without abnormal findings: Secondary | ICD-10-CM | POA: Insufficient documentation

## 2011-01-12 DIAGNOSIS — Z0000 Encounter for general adult medical examination without abnormal findings: Secondary | ICD-10-CM

## 2011-01-12 DIAGNOSIS — E7800 Pure hypercholesterolemia, unspecified: Secondary | ICD-10-CM

## 2011-01-12 DIAGNOSIS — D72829 Elevated white blood cell count, unspecified: Secondary | ICD-10-CM

## 2011-01-12 DIAGNOSIS — N946 Dysmenorrhea, unspecified: Secondary | ICD-10-CM

## 2011-01-12 DIAGNOSIS — Z1231 Encounter for screening mammogram for malignant neoplasm of breast: Secondary | ICD-10-CM | POA: Insufficient documentation

## 2011-01-12 DIAGNOSIS — R079 Chest pain, unspecified: Secondary | ICD-10-CM

## 2011-01-12 MED ORDER — NORETHINDRONE-ETH ESTRADIOL 1-35 MG-MCG PO TABS: ORAL_TABLET | ORAL | Status: DC

## 2011-01-12 NOTE — Assessment & Plan Note (Signed)
Rev labs with pt and low sat fat diet  Disc need for better chol in light of fam hx Enc to think about going back on vytorin

## 2011-01-12 NOTE — Assessment & Plan Note (Signed)
?   Real or lab error No symptoms Re check 1 mo

## 2011-01-12 NOTE — Assessment & Plan Note (Signed)
Exertional chest pressure with some sob  Worrisome for angina also with fam hx and high chol EKG reassuring with NSR one PAC rate 76 and no acute changes Ref to cardiol

## 2011-01-12 NOTE — Progress Notes (Signed)
Subjective:    Patient ID: Meredith [name], female    DOB: January 08, 1961, 50 y.o.   MRN: [id number]  HPI Here for annual health mt exam and also to review chronic med problems Has been feeling ok   occ is tired and wants to go to bed all the time Chest feels heavy and gets out of breath easily  Has CAD in family and high cholesterol  Exercise seems to induce these symptoms     Wt is down 4 lb Wants to loose more weight  Problem is appetite - at times  Eats well - very low fat and high fiber and small portions  Exercise - walks or floor exercises   Pap 12/09 ? More recent than that  Menses- are about twice per year - on continuous birth control  This controls her menses - when she has it twice per year - is ok  Plans to stop OC after June - (her daughter's wedding )     tdap 9/07  Mam 2/11 normal  Has not had her mamm yet  Self exam - no lumps or changes   colonosc 9/09 normal - 10 year f/u   Ca 8.3 - slt low   Wbc 11.6- no fever or constitutional symptoms  Lipids are improved with LDL of 133 Lab Results  Component Value Date   CHOL 207* 01/07/2011   CHOL 207* 09/19/2009   CHOL 205* 06/17/2009   Lab Results  Component Value Date   HDL 53.00 01/07/2011   HDL 40.98 09/19/2009   HDL 54.10 06/17/2009   Lab Results  Component Value Date   LDLCALC 87 05/28/2008   LDLCALC 114* 08/28/2007   Lab Results  Component Value Date   TRIG 149.0 01/07/2011   TRIG 134.0 09/19/2009   TRIG 230.0* 06/17/2009   Lab Results  Component Value Date   CHOLHDL 4 01/07/2011   CHOLHDL 4 09/19/2009   CHOLHDL 4 06/17/2009   Lab Results  Component Value Date   LDLDIRECT 133.2 01/07/2011   LDLDIRECT 142.0 09/19/2009   LDLDIRECT 115.5 06/17/2009    She stopped vytorin in the past - did not want to take it  Is trying to eat better in general  Less fried foods and fats     Review of Systems Review of Systems  Constitutional: Negative for fever, appetite change,  and unexpected weight change. pos  for fatigue  Eyes: Negative for pain and visual disturbance.  Respiratory: Negative for cough and shortness of breath.   Cardiovascular: Negative.  for edema or palpitatoins Gastrointestinal: Negative for nausea, diarrhea and constipation.  Genitourinary: Negative for urgency and frequency.  Skin: Negative for pallor. or rash Neurological: Negative for weakness, light-headedness, numbness and headaches.  Hematological: Negative for adenopathy. Does not bruise/bleed easily.  Psychiatric/Behavioral: Negative for dysphoric mood. The patient is not nervous/anxious.          Objective:   Physical Exam  Constitutional: She appears well-developed and well-nourished. No distress.  HENT:  Head: Normocephalic and atraumatic.  Right Ear: External ear normal.  Left Ear: External ear normal.  Nose: Nose normal.  Mouth/Throat: Oropharynx is clear and moist.  Eyes: Conjunctivae and EOM are normal. Pupils are equal, round, and reactive to light.  Neck: Normal range of motion. Neck supple. No JVD present. Carotid bruit is not present. No thyromegaly present.  Cardiovascular: Normal rate, regular rhythm, normal heart sounds and intact distal pulses.   Pulmonary/Chest: Effort normal and breath sounds normal. No respiratory distress.  She has no wheezes.  Abdominal: Soft. Bowel sounds are normal. She exhibits no distension. There is no tenderness.  Genitourinary: Vagina normal and uterus normal. No breast swelling, tenderness, discharge or bleeding. No vaginal discharge found.  Musculoskeletal: Normal range of motion. She exhibits no edema and no tenderness.  Lymphadenopathy:    She has no cervical adenopathy.  Neurological: She is alert. She has normal reflexes. No cranial nerve deficit. Coordination normal.  Skin: Skin is warm and dry. No rash noted. No erythema. No pallor.  Psychiatric: She has a normal mood and affect. Her behavior is normal.          Assessment & Plan:   No  problem-specific assessment & plan notes found for this encounter.

## 2011-01-12 NOTE — Assessment & Plan Note (Signed)
Very mild and no symptoms Re check 1 mo  Be on the look out for fever or other symptoms

## 2011-01-12 NOTE — Patient Instructions (Addendum)
Schedule labs one month We will do cardiology ref at check out  We will schedule mammogram at check out  Try stopping your oral contraceptive in June of next year to see if you are in menopause yet  If chest pain worsens/persists -- go to ER please

## 2011-01-12 NOTE — Assessment & Plan Note (Signed)
Mam planned Nl exam  Prompted to do self exams

## 2011-01-12 NOTE — Assessment & Plan Note (Signed)
Reviewed health habits including diet and exercise and skin cancer prevention Also reviewed health mt list, fam hx and immunizations   Rev wellness labs  

## 2011-01-12 NOTE — Assessment & Plan Note (Signed)
Pap and exam  On continuous OC due to mennorrhagia and pain Will plan to stop this in June in light of menopausal age and see what happens

## 2011-01-13 HISTORY — PX: OTHER SURGICAL HISTORY: SHX169

## 2011-01-14 ENCOUNTER — Ambulatory Visit: Payer: Self-pay | Admitting: Family Medicine

## 2011-01-19 ENCOUNTER — Encounter: Payer: Self-pay | Admitting: Family Medicine

## 2011-01-19 ENCOUNTER — Telehealth: Payer: Self-pay | Admitting: Family Medicine

## 2011-01-19 NOTE — Telephone Encounter (Signed)
Please let pt know that her mammogram shows density in R breast that warrants further evaluation / more views Ask if this is already set up or if she needs a referral

## 2011-01-21 ENCOUNTER — Ambulatory Visit (INDEPENDENT_AMBULATORY_CARE_PROVIDER_SITE_OTHER): Payer: 59 | Admitting: Cardiovascular Disease

## 2011-01-21 ENCOUNTER — Encounter: Payer: Self-pay | Admitting: Cardiovascular Disease

## 2011-01-21 DIAGNOSIS — R079 Chest pain, unspecified: Secondary | ICD-10-CM

## 2011-01-21 DIAGNOSIS — R0602 Shortness of breath: Secondary | ICD-10-CM

## 2011-01-21 DIAGNOSIS — E7800 Pure hypercholesterolemia, unspecified: Secondary | ICD-10-CM

## 2011-01-21 NOTE — Patient Instructions (Addendum)
You are doing well. No medication changes were made. Please call us if you have new issues that need to be addressed before your next appt.  We will schedule you for a treadmill for your chest discomfort and shortness of breath  You are scheduled for Thursday 02/04/11 @ 11:45 AM. Bonita Quin may eat a light breakfast and take your regular AM medications.   Exercise Test, Stress Test  An exercise stress test is a heart test (EKG) which is done while you are moving. You will walk on a treadmill. This test will tell your doctor how your heart does when it is forced to work harder and how much activity you can safely handle. A trained technician, a doctor, or physician assistant (PA) who specializes in heart disease will perform the test. WHAT SHOULD I WEAR?  Shorts or athletic pants.   Comfortable tennis shoes.   Women need to wear a bra that allows patches to be put on under it.  WHAT WILL HAPPEN DURING THE TEST?  An EKG cable will be attached to your waist. This cable is hooked up to patches, which look like round stickers stuck to your chest.   You will be asked to walk on the treadmill.   You will walk until you are too tired or until you are told to stop.  HOW LONG WILL THE TEST LAST?  It may last 30 minutes to 1 hour. The timing depends on your physical condition and the condition of your heart.   Tell the doctor or PA right away if you have:   Chest pain.  Leg cramps.   Shortness of breath.  Dizziness.   WHAT HAPPENS AFTER THE TEST?  You will rest for about 6 minutes. During this time, your technician will check your heart rhythm and blood pressure.   The testing equipment will be removed from your body and you can get dressed.   You may go home or back to your hospital room. You may keep doing all your usual activities as directed by your doctor.  FINDING OUT THE RESULTS OF YOUR TEST Ask your doctor when your test results will be ready. Make sure you follow up and get your test  results. Document Released: 11/17/2007 Document Re-Released: 08/25/2009 [facility] Patient Information 2011 [place], [place].

## 2011-01-21 NOTE — Assessment & Plan Note (Signed)
Cholesterol is mildly elevated. Family history is uncertain as her mother passed at age 50 of uncertain etiology, presumed to be cardiac though details are not clear as the patient was 50 years old. She passed in her sleep. We did mention that if she would like more aggressive lipid management, low-dose cholesterol medications could be used such as Lipitor or simvastatin.

## 2011-01-21 NOTE — Progress Notes (Signed)
Patient ID: Meredith [name], female    DOB: 01/07/1961, 50 y.o.   MRN: [id number]  HPI Comments: Meredith [name] is a very pleasant 50 year old woman with a history of mild obesity, family history of mother who passed from possible cardiac issues at age 60 of the details are not clear as the patient was 50 years old, who presents with several month history of mild shortness of breath and mild chest discomfort. She is a patient of Dr. Milinda Antis.  She reports that she has started biking and being more active in the past 2 months. She goes with her husband several times per week. She is able to do heals on her right toe has more shortness of breath. Sometimes she has some mild chest discomfort and shortness of breath when she exercises. Less symptoms at rest. Symptoms have been stable, have not been escalating. She is uncertain if it is from general deconditioning.   She also reports that sleep. She is uncertain why she wakes up in the middle of the night and is unable to get back to sleep. She tries to stay active during the daytime with no napping. She denies any significant stress either at work or at home.  EKG done at Dr. Lucretia Roers office shows normal sinus rhythm with rate 63 beats per minute with no significant ST or T wave changes   Outpatient Encounter Prescriptions as of 01/21/2011  Medication Sig Dispense Refill  . calcium carbonate (TUMS - DOSED IN MG ELEMENTAL CALCIUM) 500 MG chewable tablet Chew 2 tablets by mouth every 2 (two) hours.       . Chlorphen-Phenyleph-ASA (ALKA-SELTZER PLUS COLD) 2-7.8-325 MG TBEF Take by mouth as directed.        . norethindrone-ethinyl estradiol 1/35 (NECON 1/35, 28,) tablet Take 1 tablet daily as directed continuously  4 Package  3  . ranitidine (ZANTAC) 150 MG capsule Take 150 mg by mouth 2 (two) times daily.           Review of Systems  Constitutional: Negative.   HENT: Negative.   Eyes: Negative.   Respiratory: Positive for shortness of breath.     Cardiovascular: Positive for chest pain.  Gastrointestinal: Negative.   Musculoskeletal: Negative.   Skin: Negative.   Neurological: Negative.   Hematological: Negative.   Psychiatric/Behavioral: Positive for sleep disturbance.  All other systems reviewed and are negative.    BP 128/91  Pulse 82  Ht 5\' 8"  (1.727 m)  Wt 193 lb (87.544 kg)  BMI 29.35 kg/m2  LMP 09/27/2010   Physical Exam  Nursing note and vitals reviewed. Constitutional: She is oriented to person, place, and time. She appears well-developed and well-nourished.       Mild obesity  HENT:  Head: Normocephalic.  Nose: Nose normal.  Mouth/Throat: Oropharynx is clear and moist.  Eyes: Conjunctivae are normal. Pupils are equal, round, and reactive to light.  Neck: Normal range of motion. Neck supple. No JVD present.  Cardiovascular: Normal rate, regular rhythm, S1 normal, S2 normal, normal heart sounds and intact distal pulses.  Exam reveals no gallop and no friction rub.   No murmur heard. Pulmonary/Chest: Effort normal and breath sounds normal. No respiratory distress. She has no wheezes. She has no rales. She exhibits no tenderness.  Abdominal: Soft. Bowel sounds are normal. She exhibits no distension. There is no tenderness.  Musculoskeletal: Normal range of motion. She exhibits no edema and no tenderness.  Lymphadenopathy:    She has no cervical adenopathy.  Neurological:  She is alert and oriented to person, place, and time. Coordination normal.  Skin: Skin is warm and dry. No rash noted. No erythema.  Psychiatric: She has a normal mood and affect. Her behavior is normal. Judgment and thought content normal.         Assessment and Plan

## 2011-01-21 NOTE — Assessment & Plan Note (Signed)
Etiology of her chest pain is uncertain. We have suggested we have her perform a routine treadmill study at her convenience. Symptoms do seem to come on more with exertion. If her treadmill study is normal, this could be secondary to conditioning.

## 2011-01-21 NOTE — Telephone Encounter (Signed)
Patient notified as instructed by telephone. Pt wants to call [facility] on Fri, 01/22/11 and schedule appt herself and then she will call our office with appt info.

## 2011-01-21 NOTE — Assessment & Plan Note (Signed)
Her shortness of breath also could be secondary to recent exercise in the heat and humidity. Also could have underlying deconditioning. Treadmill ordered to exclude cardiac disease.

## 2011-01-25 ENCOUNTER — Telehealth: Payer: Self-pay | Admitting: *Deleted

## 2011-01-25 NOTE — Telephone Encounter (Signed)
Good, thanks for the update 

## 2011-01-25 NOTE — Telephone Encounter (Signed)
Pt called to let you know that she has additional mammogram views scheduled for 8/17 at [facility].

## 2011-01-28 NOTE — Telephone Encounter (Signed)
Patient phoned in to state that she has additional views scheduled on 01/29/11 at [facility].

## 2011-01-29 ENCOUNTER — Ambulatory Visit: Payer: Self-pay | Admitting: Family Medicine

## 2011-01-29 LAB — HM MAMMOGRAPHY: HM Mammogram: NORMAL

## 2011-02-01 ENCOUNTER — Telehealth: Payer: Self-pay | Admitting: Family Medicine

## 2011-02-01 ENCOUNTER — Encounter: Payer: Self-pay | Admitting: Family Medicine

## 2011-02-01 NOTE — Telephone Encounter (Signed)
Mammogram is normal (addnl views are reassuring) Please note for flow sheet if you can  Due for next screening mammogram in 1 year

## 2011-02-02 NOTE — Telephone Encounter (Signed)
Letter mailed to patient as instructed. Health care maintenance already updated. Please see result note.

## 2011-02-04 ENCOUNTER — Ambulatory Visit (INDEPENDENT_AMBULATORY_CARE_PROVIDER_SITE_OTHER): Payer: 59 | Admitting: Cardiovascular Disease

## 2011-02-04 ENCOUNTER — Ambulatory Visit: Payer: 59 | Admitting: Cardiovascular Disease

## 2011-02-04 VITALS — BP 136/88 | HR 76 | Ht 68.0 in | Wt 194.0 lb

## 2011-02-04 DIAGNOSIS — R079 Chest pain, unspecified: Secondary | ICD-10-CM

## 2011-02-04 NOTE — Progress Notes (Signed)
Exercise Treadmill Test   Treadmill ordered for recent epsiodes of chest pain.  Resting EKG shows NSR with rate of 81 bpm, no St ,ot T wave changes Resting blood pressure of 136/88 Stand bruce protocal was used.  Patient exercised for 8 min 06 sec,  Peak heart rate of 151 bpm.  This was 88% of the maximum predicted heart rate (target heart rate 145). Total METS achieved was 10.1 No symptoms of chest pain or lightheadedness were reported at peak stress or in recovery.  Peak Blood pressure recorded was 150/72. Heart rate at 3 minutes in recovery was 101 bpm.  FINAL IMPRESSION: Normal exercise stress test. No significant EKG changes concerning for ischemia. Excellent exercise tolerance.

## 2011-02-06 ENCOUNTER — Encounter: Payer: Self-pay | Admitting: Family Medicine

## 2011-02-08 ENCOUNTER — Other Ambulatory Visit: Payer: Self-pay | Admitting: Cardiovascular Disease

## 2011-02-12 ENCOUNTER — Other Ambulatory Visit: Payer: 59

## 2011-03-26 ENCOUNTER — Ambulatory Visit (INDEPENDENT_AMBULATORY_CARE_PROVIDER_SITE_OTHER): Payer: 59 | Admitting: Family Medicine

## 2011-03-26 ENCOUNTER — Encounter: Payer: Self-pay | Admitting: Family Medicine

## 2011-03-26 VITALS — BP 126/78 | HR 76 | Temp 98.6°F | Wt 200.2 lb

## 2011-03-26 DIAGNOSIS — K219 Gastro-esophageal reflux disease without esophagitis: Secondary | ICD-10-CM

## 2011-03-26 NOTE — Progress Notes (Signed)
  Subjective:    Patient ID: Meredith [name], female    DOB: Aug 24, 1960, 49 y.o.   MRN: [id number]  HPI CC: ?GERD  H/o GERD, s/p EGD a few years back which confirmed dx per pt.  Also with HH.  When eats well, reflux gets better.  Knows foods to avoid.  For last 2 wks having different flavor in mouth, unpleasant "mediciny".  Sense of taste and smell otherwise intact.  Previously on prilosec, protonix and didn't help with reflux.  Now tries to control with diet and daily tums, occasional zantac.  Went to dentist yesterday - told things looking ok there.  Occasional epigastric pain but no change from normal.  No nausea/vomiting, constipation, fevers/chills, chest pain or tightness, sob.  Recent stress test normal.  No recent worsening nasal congestion.  Does get occasional sinus pressure headaches, worse this fall with allergies acting up.  No PNDrip.  Weight gain.  No night sweats, early satiety. Wt Readings from Last 3 Encounters:  03/26/11 200 lb 4 oz (90.833 kg)  02/04/11 194 lb (87.998 kg)  01/21/11 193 lb (87.544 kg)   Review of Systems Per HPI    Objective:   Physical Exam  Nursing note and vitals reviewed. Constitutional: She appears well-developed and well-nourished. No distress.  HENT:  Head: Normocephalic and atraumatic.  Right Ear: Hearing, tympanic membrane, external ear and ear canal normal.  Left Ear: Hearing, tympanic membrane, external ear and ear canal normal.  Nose: Nose normal. No mucosal edema or rhinorrhea. Right sinus exhibits no maxillary sinus tenderness and no frontal sinus tenderness. Left sinus exhibits no maxillary sinus tenderness and no frontal sinus tenderness.  Mouth/Throat: Uvula is midline, oropharynx is clear and moist and mucous membranes are normal. No oropharyngeal exudate, posterior oropharyngeal edema, posterior oropharyngeal erythema or tonsillar abscesses.       Normal stetson's duct  Eyes: Conjunctivae and EOM are normal. Pupils are equal,  round, and reactive to light. No scleral icterus.  Neck: Normal range of motion. Neck supple.  Cardiovascular: Normal rate, regular rhythm, normal heart sounds and intact distal pulses.   No murmur heard. Pulmonary/Chest: Effort normal and breath sounds normal. No respiratory distress. She has no wheezes. She has no rales.  Abdominal: Soft. Bowel sounds are normal. She exhibits no distension and no mass. There is no hepatosplenomegaly. There is no tenderness. There is no rebound, no guarding and no CVA tenderness.  Lymphadenopathy:    She has no cervical adenopathy.  Skin: Skin is warm and dry. No rash noted.  Psychiatric: She has a normal mood and affect.          Assessment & Plan:

## 2011-03-26 NOTE — Patient Instructions (Signed)
I don't think this is coming from sinuses, more likely from reflux. GERD precautions:  Head of bed elevated. Avoidance of citrus, fatty foods, chocolate, peppermint, and excessive alcohol, along with sodas, orange juice (acidic drinks) At least a few hours between dinner and bed, minimize naps after eating.

## 2011-03-26 NOTE — Assessment & Plan Note (Addendum)
Anticipate abnormal taste in mouth attributed to GERD, doubt sinus related. Discussed GERD precautions. Trial of nexium as prilosec, protonix have not been effective in past. Per pt has had EGD in last few years.  No dysphagia or other red flags. If not better, return for further evaluation.

## 2011-03-31 ENCOUNTER — Other Ambulatory Visit: Payer: Self-pay | Admitting: Family Medicine

## 2011-04-01 NOTE — Telephone Encounter (Signed)
[facility] request refill Necon 1/35 28 tab. On 01/12/11 note plan to stop continuous OC in June. Is it OK to refill?

## 2011-04-01 NOTE — Telephone Encounter (Signed)
Please call her and see what the deal is with that situation-thanks

## 2011-04-07 NOTE — Telephone Encounter (Signed)
Left v/m at home # for pt to call back. Work # is recording and cannot leave v/m and cell discontinued.

## 2011-04-14 ENCOUNTER — Telehealth: Payer: Self-pay | Admitting: *Deleted

## 2011-04-14 NOTE — Telephone Encounter (Signed)
Dr. Reece Agar recommended the nexium on 03-26-11.

## 2011-04-14 NOTE — Telephone Encounter (Signed)
1)Pt left message that she is returning a call re a prescription. 2) Pt wants to have a 90 day rx for new med Nexium, she states she saw a Dr here and was recommended Nexium, but she wanted to wait. She now wants rx.

## 2011-04-14 NOTE — Telephone Encounter (Signed)
Left message with gentleman who answered phone at home and he will have pt call back.

## 2011-04-14 NOTE — Telephone Encounter (Signed)
Left message with gentleman for pt to call back. ? Which doctor recommended Nexium and when and also need to ask question about Necon refill.

## 2011-04-15 MED ORDER — ESOMEPRAZOLE MAGNESIUM 40 MG PO CPDR: 40.0000 mg | DELAYED_RELEASE_CAPSULE | Freq: Every day | ORAL | Status: DC

## 2011-04-15 NOTE — Telephone Encounter (Signed)
  Left message at [phone] for pt to call back.

## 2011-04-15 NOTE — Telephone Encounter (Signed)
Stop the zantac if she is taking it and try the nexium F/u in 4-6 weeks Px written for call in

## 2011-04-15 NOTE — Telephone Encounter (Signed)
Left v/m at [phone] for pt to call back.

## 2011-04-16 ENCOUNTER — Other Ambulatory Visit: Payer: Self-pay | Admitting: Family Medicine

## 2011-04-16 MED ORDER — ESOMEPRAZOLE MAGNESIUM 40 MG PO CPDR: 40.0000 mg | DELAYED_RELEASE_CAPSULE | Freq: Every day | ORAL | Status: DC

## 2011-04-16 NOTE — Telephone Encounter (Signed)
Spoke with patient.  Nexium Rx sent to [facility] as requested.

## 2011-04-16 NOTE — Telephone Encounter (Signed)
Returning a call about refills.  Patient has questions for CMA.  Please return her call at home [phone] or at cell phone [phone]. Thanks

## 2011-04-20 NOTE — Telephone Encounter (Signed)
Will refill electronically to [facility] 

## 2011-04-20 NOTE — Telephone Encounter (Signed)
Pt said she wants to stay on Necon until after her daughter's wedding in June. Please send refill to [facility].

## 2011-04-28 ENCOUNTER — Other Ambulatory Visit: Payer: Self-pay | Admitting: *Deleted

## 2011-04-28 MED ORDER — ESOMEPRAZOLE MAGNESIUM 40 MG PO CPDR: 40.0000 mg | DELAYED_RELEASE_CAPSULE | Freq: Every day | ORAL | Status: DC

## 2011-04-28 NOTE — Telephone Encounter (Signed)
This was refilled earlier this month but was noted as phone in- it was not phoned in.

## 2011-09-03 ENCOUNTER — Encounter: Payer: Self-pay | Admitting: Family Medicine

## 2011-09-03 ENCOUNTER — Ambulatory Visit (INDEPENDENT_AMBULATORY_CARE_PROVIDER_SITE_OTHER): Payer: BC Managed Care – PPO | Admitting: Family Medicine

## 2011-09-03 VITALS — BP 112/72 | HR 93 | Temp 98.9°F | Wt 193.0 lb

## 2011-09-03 DIAGNOSIS — J069 Acute upper respiratory infection, unspecified: Secondary | ICD-10-CM

## 2011-09-03 MED ORDER — HYDROCODONE-HOMATROPINE 5-1.5 MG/5ML PO SYRP: 5.0000 mL | ORAL_SOLUTION | Freq: Three times a day (TID) | ORAL | Status: AC | PRN

## 2011-09-03 NOTE — Patient Instructions (Signed)
Drink plenty of fluids, take tylenol as needed, and gargle with warm salt water for your throat.  This should gradually improve.  Take care.  Let us know if you have other concerns.  Take hycodan for the cough.  It can make you drowsy.  Try to rest your voice.  You likely have a virus.

## 2011-09-03 NOTE — Progress Notes (Signed)
duration of symptoms: 2-3 days Rhinorrhea: yes congestion:yes ear pain: L ear pain sore throat: yes Cough:yes, chest sore with cough, dry cough myalgias:yes other concerns: husband was sick Voice change, hoarse Taking robitussin DM with some mild relief.   ROS: See HPI.  Otherwise negative.    Meds, vitals, and allergies reviewed.   GEN: nad, alert and oriented HEENT: mucous membranes moist, TM w/o erythema, nasal epithelium injected with scab in L nostril, OP with cobblestoning NECK: supple w/o LA CV: rrr. PULM: ctab, no inc wob ABD: soft, +bs EXT: no edema

## 2011-09-06 DIAGNOSIS — J069 Acute upper respiratory infection, unspecified: Secondary | ICD-10-CM | POA: Insufficient documentation

## 2011-09-06 NOTE — Assessment & Plan Note (Signed)
Likely viral, nontoxic, supportive care with sedation caution for hycodan and f/u prn she agrees.

## 2011-12-06 ENCOUNTER — Other Ambulatory Visit: Payer: Self-pay | Admitting: Family Medicine

## 2011-12-24 ENCOUNTER — Ambulatory Visit (INDEPENDENT_AMBULATORY_CARE_PROVIDER_SITE_OTHER): Payer: BC Managed Care – PPO | Admitting: Gynecology

## 2011-12-24 ENCOUNTER — Encounter: Payer: Self-pay | Admitting: Gynecology

## 2011-12-24 VITALS — BP 116/74 | Ht 67.0 in | Wt 188.0 lb

## 2011-12-24 DIAGNOSIS — Z131 Encounter for screening for diabetes mellitus: Secondary | ICD-10-CM

## 2011-12-24 DIAGNOSIS — Z01419 Encounter for gynecological examination (general) (routine) without abnormal findings: Secondary | ICD-10-CM

## 2011-12-24 DIAGNOSIS — Z13220 Encounter for screening for lipoid disorders: Secondary | ICD-10-CM

## 2011-12-24 LAB — CBC WITH DIFFERENTIAL/PLATELET
Basophils Absolute: 0 10*3/uL (ref 0.0–0.1)
Basophils Relative: 0 % (ref 0–1)
Eosinophils Absolute: 0.1 10*3/uL (ref 0.0–0.7)
Hemoglobin: 13.9 g/dL (ref 12.0–15.0)
MCH: 32.3 pg (ref 26.0–34.0)
MCHC: 33.7 g/dL (ref 30.0–36.0)
Monocytes Relative: 6 % (ref 3–12)
Neutro Abs: 4.1 10*3/uL (ref 1.7–7.7)
Neutrophils Relative %: 59 % (ref 43–77)
Platelets: 350 10*3/uL (ref 150–400)
RDW: 13.9 % (ref 11.5–15.5)

## 2011-12-24 LAB — LIPID PANEL
Cholesterol: 240 mg/dL — ABNORMAL HIGH (ref 0–200)
LDL Cholesterol: 166 mg/dL — ABNORMAL HIGH (ref 0–99)
Triglycerides: 109 mg/dL (ref <150)

## 2011-12-24 NOTE — Progress Notes (Signed)
Meredith [name] [facility] [birth year] [id number]        51 y.o. G3 P2 Ab1 new patient for annual exam.  Several issues noted below.  Past medical history,surgical history, medications, allergies, family history and social history were all reviewed and documented in the EPIC chart. ROS:  Was performed and pertinent positives and negatives are included in the history.  Exam: Sherrilyn Rist assistant Filed Vitals:   12/24/11 0848  BP: 116/74   General appearance  Normal Skin grossly normal Head/Neck normal with no cervical or supraclavicular adenopathy thyroid normal Lungs  clear Cardiac RR, without RMG Abdominal  soft, nontender, without masses, organomegaly or hernia Breasts  examined lying and sitting without masses, retractions, discharge or axillary adenopathy. Pelvic  Ext/BUS/vagina  normal   Cervix  normal   Uterus  axial, normal size, shape and contour, midline and mobile nontender   Adnexa  Without masses or tenderness    Anus and perineum  normal   Rectovaginal  normal sphincter tone without palpated masses or tenderness.    Assessment/Plan:  51 y.o. G3 P2 Ab1 female for annual exam.    1. Contraception. Patient's husband had vasectomy. She is on 1/35 birth control pills due to a history of heavier menses. She has been on these for a number of years. I reviewed the risks of birth control pills to include stroke heart attack DVT. As her husband had a vasectomy and she is using these for a history of heavy menses of recommended she stop them now keep a menstrual calendar and then we'll go from there.  If her periods would return heavy or irregular then we may want to consider reinitiating them for menstrual regulation but I think from a risk versus benefit ratio which is stopped and now get a baseline as far as how her periods are. Patient agrees with this.  If she has significant irregularity or menopausal symptoms and she'll represent for evaluation. 2. Pap smear. Last Pap smear reported 2012. No  Pap smear done today. She has no history of abnormal Pap smears with reported annual Pap smears. Recommended less frequent screening interval every 3-5 years and she agrees with this. 3. Mammography. Patient is due August 2013 and I reminded her to schedule this. SBE monthly reviewed. 4. Colonoscopy. Patient has had a colonoscopy several years ago due to a history of GI abnormalities. She'll follow up with them for repeat at their suggested interval. 5. Bone health. Increase calcium vitamin D reviewed. We'll plan baseline DEXA later into the menopause. 6. Health maintenance. Patient sees Dr. Milinda Antis routinely. She did ask if I could check a glucose and lipid profile for insurance requirements and will go ahead and do that for her. Patient will see me in a year assuming menses are regular, sooner if any issues.    Dara Lords MD, 9:36 AM 12/24/2011

## 2011-12-25 LAB — URINALYSIS W MICROSCOPIC + REFLEX CULTURE
Bacteria, UA: NONE SEEN
Casts: NONE SEEN
Crystals: NONE SEEN
Ketones, ur: NEGATIVE mg/dL
Nitrite: NEGATIVE
Specific Gravity, Urine: 1.019 (ref 1.005–1.030)
Squamous Epithelial / LPF: NONE SEEN
pH: 6 (ref 5.0–8.0)

## 2012-01-24 ENCOUNTER — Telehealth: Payer: Self-pay

## 2012-01-24 NOTE — Telephone Encounter (Signed)
Patient advised.  Will call in for appt tomorrow when she is at work to see her work schedule.

## 2012-01-24 NOTE — Telephone Encounter (Signed)
Go ahead and take the vytorin and then f/u with me in 4-6 weeks for visit and labs also that day  Thanks Also eat low fat diet (Avoid red meat/ fried foods/ egg yolks/ fatty breakfast meats/ butter, cheese and high fat dairy/ and shellfish  )

## 2012-01-24 NOTE — Telephone Encounter (Signed)
Pt saw GYN, cholesterol values were high; pt had stopped Vytorin some time ago;pt has not been following diet;pt has started back on Vytorin. Pt not sure if pt should take Vytorin or another med for cholesterol. Pt wanted to know if pt needs f/u visit or lab appt.Please advise. Lab results are in pts chart 12/24/11 under lab tab and pt had been fasting. [facility] [address].

## 2012-02-08 ENCOUNTER — Other Ambulatory Visit: Payer: Self-pay | Admitting: Family Medicine

## 2012-02-29 ENCOUNTER — Telehealth: Payer: Self-pay | Admitting: Family Medicine

## 2012-02-29 DIAGNOSIS — E7800 Pure hypercholesterolemia, unspecified: Secondary | ICD-10-CM

## 2012-02-29 DIAGNOSIS — Z0000 Encounter for general adult medical examination without abnormal findings: Secondary | ICD-10-CM

## 2012-02-29 NOTE — Telephone Encounter (Signed)
Message copied by Judy Pimple on Tue Feb 29, 2012  8:52 PM ------      Message from: Alvina Chou      Created: Tue Feb 29, 2012  4:41 PM      Regarding: Lab orders for [facility] 9.18.13       Patient is scheduled for CPX labs, please order future labs, Thanks , Camelia Eng

## 2012-03-01 ENCOUNTER — Other Ambulatory Visit (INDEPENDENT_AMBULATORY_CARE_PROVIDER_SITE_OTHER): Payer: BC Managed Care – PPO

## 2012-03-01 DIAGNOSIS — E7800 Pure hypercholesterolemia, unspecified: Secondary | ICD-10-CM

## 2012-03-01 DIAGNOSIS — Z0000 Encounter for general adult medical examination without abnormal findings: Secondary | ICD-10-CM

## 2012-03-01 LAB — CBC WITH DIFFERENTIAL/PLATELET
Basophils Relative: 0.8 % (ref 0.0–3.0)
Eosinophils Relative: 0.9 % (ref 0.0–5.0)
HCT: 39.1 % (ref 36.0–46.0)
Hemoglobin: 12.9 g/dL (ref 12.0–15.0)
Lymphocytes Relative: 41.2 % (ref 12.0–46.0)
Lymphs Abs: 2.9 10*3/uL (ref 0.7–4.0)
Monocytes Relative: 6.8 % (ref 3.0–12.0)
Neutro Abs: 3.5 10*3/uL (ref 1.4–7.7)
RBC: 3.94 Mil/uL (ref 3.87–5.11)
WBC: 7 10*3/uL (ref 4.5–10.5)

## 2012-03-01 LAB — COMPREHENSIVE METABOLIC PANEL
ALT: 17 U/L (ref 0–35)
BUN: 14 mg/dL (ref 6–23)
CO2: 27 mEq/L (ref 19–32)
Calcium: 8.3 mg/dL — ABNORMAL LOW (ref 8.4–10.5)
Chloride: 108 mEq/L (ref 96–112)
Creatinine, Ser: 1 mg/dL (ref 0.4–1.2)
GFR: 60.75 mL/min (ref >60.00)
Glucose, Bld: 98 mg/dL (ref 70–99)
Total Bilirubin: 0.4 mg/dL (ref 0.3–1.2)

## 2012-03-01 LAB — LIPID PANEL
HDL: 46.8 mg/dL (ref >39.00)
Total CHOL/HDL Ratio: 5
VLDL: 25 mg/dL (ref 0.0–40.0)

## 2012-03-08 ENCOUNTER — Ambulatory Visit (INDEPENDENT_AMBULATORY_CARE_PROVIDER_SITE_OTHER): Payer: BC Managed Care – PPO | Admitting: Family Medicine

## 2012-03-08 ENCOUNTER — Encounter: Payer: Self-pay | Admitting: Family Medicine

## 2012-03-08 VITALS — BP 124/72 | HR 68 | Temp 98.2°F | Ht 67.0 in | Wt 192.8 lb

## 2012-03-08 DIAGNOSIS — E7800 Pure hypercholesterolemia, unspecified: Secondary | ICD-10-CM

## 2012-03-08 DIAGNOSIS — M5137 Other intervertebral disc degeneration, lumbosacral region: Secondary | ICD-10-CM

## 2012-03-08 DIAGNOSIS — Z1231 Encounter for screening mammogram for malignant neoplasm of breast: Secondary | ICD-10-CM

## 2012-03-08 DIAGNOSIS — M5136 Other intervertebral disc degeneration, lumbar region: Secondary | ICD-10-CM | POA: Insufficient documentation

## 2012-03-08 NOTE — Assessment & Plan Note (Signed)
Scheduled screening mam Enc self exams She sees gyn

## 2012-03-08 NOTE — Patient Instructions (Addendum)
We will refer you to orthopedics and also for mammogram at check out Avoid red meat/ fried foods/ egg yolks/ fatty breakfast meats/ butter, cheese and high fat dairy/ and shellfish  Schedule fasting lab in 3 months and then follow up for cholesterol  Avoid red meat/ fried foods/ egg yolks/ fatty breakfast meats/ butter, cheese and high fat dairy/ and shellfish   Also - work to 5 d of exercise per week for 30 minutes   Cholesterol Control Diet Cholesterol levels in your body are determined significantly by your diet. Cholesterol levels may also be related to heart disease. The following material helps to explain this relationship and discusses what you can do to help keep your heart healthy. Not all cholesterol is bad. Low-density lipoprotein (LDL) cholesterol is the "bad" cholesterol. It may cause fatty deposits to build up inside your arteries. High-density lipoprotein (HDL) cholesterol is "good." It helps to remove the "bad" LDL cholesterol from your blood. Cholesterol is a very important risk factor for heart disease. Other risk factors are high blood pressure, smoking, stress, heredity, and weight. The heart muscle gets its supply of blood through the coronary arteries. If your LDL cholesterol is high and your HDL cholesterol is low, you are at risk for having fatty deposits build up in your coronary arteries. This leaves less room through which blood can flow. Without sufficient blood and oxygen, the heart muscle cannot function properly and you may feel chest pains (angina pectoris). When a coronary artery closes up entirely, a part of the heart muscle may die, causing a heart attack (myocardial infarction). CHECKING CHOLESTEROL When your caregiver sends your blood to a lab to be analyzed for cholesterol, a complete lipid (fat) profile may be done. With this test, the total amount of cholesterol and levels of LDL and HDL are determined. Triglycerides are a type of fat that circulates in the blood and  can also be used to determine heart disease risk. The list below describes what the numbers should be: Test: Total Cholesterol.  Less than 200 mg/dl.  Test: LDL "bad cholesterol."  Less than 100 mg/dl.   Less than 70 mg/dl if you are at very high risk of a heart attack or sudden cardiac death.  Test: HDL "good cholesterol."  Greater than 50 mg/dl for women.   Greater than 40 mg/dl for men.  Test: Triglycerides.  Less than 150 mg/dl.  CONTROLLING CHOLESTEROL WITH DIET Although exercise and lifestyle factors are important, your diet is key. That is because certain foods are known to raise cholesterol and others to lower it. The goal is to balance foods for their effect on cholesterol and more importantly, to replace saturated and trans fat with other types of fat, such as monounsaturated fat, polyunsaturated fat, and omega-3 fatty acids. On average, a person should consume no more than 15 to 17 g of saturated fat daily. Saturated and trans fats are considered "bad" fats, and they will raise LDL cholesterol. Saturated fats are primarily found in animal products such as meats, butter, and cream. However, that does not mean you need to sacrifice all your favorite foods. Today, there are good tasting, low-fat, low-cholesterol substitutes for most of the things you like to eat. Choose low-fat or nonfat alternatives. Choose round or loin cuts of red meat, since these types of cuts are lowest in fat and cholesterol. Chicken (without the skin), fish, veal, and ground [place] breast are excellent choices. Eliminate fatty meats, such as hot dogs and salami. Even shellfish  have little or no saturated fat. Have a 3 oz (85 g) portion when you eat lean meat, poultry, or fish. Trans fats are also called "partially hydrogenated oils." They are oils that have been scientifically manipulated so that they are solid at room temperature resulting in a longer shelf life and improved taste and texture of foods in which  they are added. Trans fats are found in stick margarine, some tub margarines, cookies, crackers, and baked goods.   When baking and cooking, oils are an excellent substitute for butter. The monounsaturated oils are especially beneficial since it is believed they lower LDL and raise HDL. The oils you should avoid entirely are saturated tropical oils, such as coconut and palm.   Remember to eat liberally from food groups that are naturally free of saturated and trans fat, including fish, fruit, vegetables, beans, grains (barley, rice, couscous, bulgur wheat), and pasta (without cream sauces).   IDENTIFYING FOODS THAT LOWER CHOLESTEROL   Soluble fiber may lower your cholesterol. This type of fiber is found in fruits such as apples, vegetables such as broccoli, potatoes, and carrots, legumes such as beans, peas, and lentils, and grains such as barley. Foods fortified with plant sterols (phytosterol) may also lower cholesterol. You should eat at least 2 g per day of these foods for a cholesterol lowering effect.   Read package labels to identify low-saturated fats, trans fats free, and low-fat foods at the supermarket. Select cheeses that have only 2 to 3 g saturated fat per ounce. Use a heart-healthy tub margarine that is free of trans fats or partially hydrogenated oil. When buying baked goods (cookies, crackers), avoid partially hydrogenated oils. Breads and muffins should be made from whole grains (whole-wheat or whole oat flour, instead of "flour" or "enriched flour"). Buy non-creamy canned soups with reduced salt and no added fats.   FOOD PREPARATION TECHNIQUES   Never deep-fry. If you must fry, either stir-fry, which uses very little fat, or use non-stick cooking sprays. When possible, broil, bake, or roast meats, and steam vegetables. Instead of dressing vegetables with butter or margarine, use lemon and herbs, applesauce and cinnamon (for squash and sweet potatoes), nonfat yogurt, salsa, and low-fat  dressings for salads.   LOW-SATURATED FAT / LOW-FAT FOOD SUBSTITUTES Meats / Saturated Fat (g)  Avoid: Steak, marbled (3 oz/85 g) / 11 g   Choose: Steak, lean (3 oz/85 g) / 4 g   Avoid: Hamburger (3 oz/85 g) / 7 g   Choose: Hamburger, lean (3 oz/85 g) / 5 g   Avoid: Ham (3 oz/85 g) / 6 g   Choose: Ham, lean cut (3 oz/85 g) / 2.4 g   Avoid: Chicken, with skin, dark meat (3 oz/85 g) / 4 g   Choose: Chicken, skin removed, dark meat (3 oz/85 g) / 2 g   Avoid: Chicken, with skin, light meat (3 oz/85 g) / 2.5 g   Choose: Chicken, skin removed, light meat (3 oz/85 g) / 1 g  Dairy / Saturated Fat (g)  Avoid: Whole milk (1 cup) / 5 g   Choose: Low-fat milk, 2% (1 cup) / 3 g   Choose: Low-fat milk, 1% (1 cup) / 1.5 g   Choose: Skim milk (1 cup) / 0.3 g   Avoid: Hard cheese (1 oz/28 g) / 6 g   Choose: Skim milk cheese (1 oz/28 g) / 2 to 3 g   Avoid: Cottage cheese, 4% fat (1 cup) / 6.5 g   Choose:  Low-fat cottage cheese, 1% fat (1 cup) / 1.5 g   Avoid: Ice cream (1 cup) / 9 g   Choose: Sherbet (1 cup) / 2.5 g   Choose: Nonfat frozen yogurt (1 cup) / 0.3 g   Choose: Frozen fruit bar / trace   Avoid: Whipped cream (1 tbs) / 3.5 g   Choose: Nondairy whipped topping (1 tbs) / 1 g  Condiments / Saturated Fat (g)  Avoid: Mayonnaise (1 tbs) / 2 g   Choose: Low-fat mayonnaise (1 tbs) / 1 g   Avoid: Butter (1 tbs) / 7 g   Choose: Extra light margarine (1 tbs) / 1 g   Avoid: Coconut oil (1 tbs) / 11.8 g   Choose: Olive oil (1 tbs) / 1.8 g   Choose: Corn oil (1 tbs) / 1.7 g   Choose: Safflower oil (1 tbs) / 1.2 g   Choose: Sunflower oil (1 tbs) / 1.4 g   Choose: Soybean oil (1 tbs) / 2.4 g   Choose: Canola oil (1 tbs) / 1 g  Document Released: 05/31/2005 Document Revised: 05/20/2011 Document Reviewed: 11/19/2010 [facility] Patient Information 2012 [place], [place].

## 2012-03-08 NOTE — Assessment & Plan Note (Signed)
LDL is up with poor diet Disc goals for lipids and reasons to control them Rev labs with pt Rev low sat fat diet in detail  Lab 3 mo and f/u  Consider statin if not imp

## 2012-03-08 NOTE — Assessment & Plan Note (Signed)
Ref to ortho for re check  Had injections that helped in the past  Disc imp of low impact exercise

## 2012-03-08 NOTE — Progress Notes (Signed)
Subjective:    Patient ID: Meredith [name], female    DOB: [birth year], 51 y.o.   MRN: [id number]  HPI Here for f/u for chronic conditions  Wt is up 4 lb Eats terribly- is hungry all the time  Eating too much and eating the wrong things  Lacks energy No exercise   Having back and buttock pain - occ rad to her L leg  Going on for a long long time Shots in her back for years  Makes it hard to walk  Has disc dz- ? Herniated  Was at ortho in [place]   mammo 7/12- needs to schedule that  No changes on self exam    Flu shot-- will get at work on oct 1st   Hyperlipidemia Lab Results  Component Value Date   CHOL 212* 03/01/2012   CHOL 240* 12/24/2011   CHOL 207* 01/07/2011   Lab Results  Component Value Date   HDL 46.80 03/01/2012   HDL 52 12/24/2011   HDL 53.00 01/07/2011   Lab Results  Component Value Date   LDLCALC 166* 12/24/2011   LDLCALC 87 05/28/2008   LDLCALC 114* 08/28/2007   Lab Results  Component Value Date   TRIG 125.0 03/01/2012   TRIG 109 12/24/2011   TRIG 149.0 01/07/2011   Lab Results  Component Value Date   CHOLHDL 5 03/01/2012   CHOLHDL 4.6 12/24/2011   CHOLHDL 4 01/07/2011   Lab Results  Component Value Date   LDLDIRECT 140.4 03/01/2012   LDLDIRECT 133.2 01/07/2011   LDLDIRECT 142.0 09/19/2009    Will try to eat a low chol diet - not doing well  Does eat red meat and fried foods and high fat dairy     Chemistry      Component Value Date/Time   NA 141 03/01/2012 0752   K 4.3 03/01/2012 0752   CL 108 03/01/2012 0752   CO2 27 03/01/2012 0752   BUN 14 03/01/2012 0752   CREATININE 1.0 03/01/2012 0752      Component Value Date/Time   CALCIUM 8.3* 03/01/2012 0752   ALKPHOS 49 03/01/2012 0752   AST 19 03/01/2012 0752   ALT 17 03/01/2012 0752   BILITOT 0.4 03/01/2012 0752      Lab Results  Component Value Date   WBC 7.0 03/01/2012   HGB 12.9 03/01/2012   HCT 39.1 03/01/2012   MCV 99.3 03/01/2012   PLT 290.0 03/01/2012    She is not having menses on the pill  - is continuous  Plans to stop it and see what happens   Patient Active Problem List  Diagnosis  . HYPERCHOLESTEROLEMIA  . ALLERGIC RHINITIS  . GERD  . IBS  . DYSMENORRHEA  . KERATOSIS  . Pain in joint, lower leg  . PLANTAR FASCIITIS, BILATERAL  . CHEST PAIN  . MIGRAINES, HX OF  . Sciatica  . Neck pain  . Routine general medical examination at a health care facility  . Chest pain  . Hypocalcemia  . Leukocytosis  . Other screening mammogram  . Gynecological examination  . SOB (shortness of breath)  . URI (upper respiratory infection)   Past Medical History  Diagnosis Date  . Allergic rhinitis   . GERD (gastroesophageal reflux disease)   . Esophagitis   . IBS (irritable bowel syndrome)   . HLD (hyperlipidemia)   . Obesity   . Dysmenorrhea   . Pure hypercholesterolemia   . Acquired keratoderma   . Pain in joint, lower leg   .  History of migraine headaches   . Plantar fascial fibromatosis   . Acute sinusitis, unspecified    Past Surgical History  Procedure Date  . Treadmill stress test 01/13/11   History  Substance Use Topics  . Smoking status: Never Smoker   . Smokeless tobacco: Never Used  . Alcohol Use: Yes     Occasional   Family History  Problem Relation Age of Onset  . Heart attack Mother   . Hypertension Mother   . Lung cancer Father     + smoker  . GER disease Father     esophageal stricture  . Hypertension Brother   . Other Sister 53    MAC disease   No Known Allergies Current Outpatient Prescriptions on File Prior to Visit  Medication Sig Dispense Refill  . calcium carbonate (TUMS - DOSED IN MG ELEMENTAL CALCIUM) 500 MG chewable tablet Chew 2 tablets by mouth every 2 (two) hours as needed.       Griffith Citron 1/35 tablet TAKE 1 TABLET DAILY AS DIRECTED CONTINUOUSLY (NEED TO BE SEEN FOR FURTHER REFILLS)  4 tablet  0  . esomeprazole (NEXIUM) 40 MG capsule Take 1 capsule (40 mg total) by mouth daily.  90 capsule  3  . ibuprofen (ADVIL,MOTRIN) 200  MG tablet Take 200 mg by mouth every 6 (six) hours as needed.         Review of Systems Review of Systems  Constitutional: Negative for fever, appetite change, fatigue and unexpected weight change.  Eyes: Negative for pain and visual disturbance.  Respiratory: Negative for cough and shortness of breath.   Cardiovascular: Negative for cp or palpitations    Gastrointestinal: Negative for nausea, diarrhea and constipation.  Genitourinary: Negative for urgency and frequency.  Skin: Negative for pallor or rash MSK pos for back and leg pain    Neurological: Negative for weakness, light-headedness, numbness and headaches.  Hematological: Negative for adenopathy. Does not bruise/bleed easily.  Psychiatric/Behavioral: Negative for dysphoric mood. The patient is not nervous/anxious.         Objective:   Physical Exam  Constitutional: She appears well-developed and well-nourished. No distress.       obese and well appearing   HENT:  Head: Normocephalic and atraumatic.  Mouth/Throat: Oropharynx is clear and moist.  Eyes: Conjunctivae normal and EOM are normal. Pupils are equal, round, and reactive to light. No scleral icterus.  Neck: Normal range of motion. Neck supple. No JVD present. Carotid bruit is not present. No thyromegaly present.  Cardiovascular: Normal rate, regular rhythm, normal heart sounds and intact distal pulses.  Exam reveals no gallop.   Pulmonary/Chest: Effort normal and breath sounds normal. No respiratory distress. She has no wheezes.  Abdominal: Soft. Bowel sounds are normal. She exhibits no distension, no abdominal bruit and no mass. There is no tenderness.  Musculoskeletal: She exhibits tenderness. She exhibits no edema.       Lumbar back: She exhibits decreased range of motion, tenderness, bony tenderness and spasm. She exhibits no swelling.       Tender over L4- S1 and also in L perilumbar musculature  Pos SLR for leg pain Nl rom hip  limted flex and ext of LS due to  pain Nl gait   Lymphadenopathy:    She has no cervical adenopathy.  Neurological: She is alert. She has normal strength and normal reflexes. She displays no atrophy. No cranial nerve deficit or sensory deficit. She exhibits normal muscle tone. Coordination normal.  Skin: Skin is  warm and dry. No rash noted. No erythema. No pallor.  Psychiatric: She has a normal mood and affect.          Assessment & Plan:

## 2012-03-14 ENCOUNTER — Ambulatory Visit: Payer: BC Managed Care – PPO

## 2012-03-17 ENCOUNTER — Other Ambulatory Visit: Payer: Self-pay | Admitting: Sports Medicine

## 2012-03-17 DIAGNOSIS — M5410 Radiculopathy, site unspecified: Secondary | ICD-10-CM

## 2012-03-22 ENCOUNTER — Inpatient Hospital Stay: Admission: RE | Admit: 2012-03-22 | Payer: BC Managed Care – PPO | Source: Ambulatory Visit

## 2012-03-27 ENCOUNTER — Ambulatory Visit
Admission: RE | Admit: 2012-03-27 | Discharge: 2012-03-27 | Disposition: A | Discharge status: Home / Self Care | Payer: BC Managed Care – PPO | Source: Ambulatory Visit | Attending: Sports Medicine | Admitting: Sports Medicine

## 2012-03-27 DIAGNOSIS — M5410 Radiculopathy, site unspecified: Secondary | ICD-10-CM

## 2012-05-31 ENCOUNTER — Other Ambulatory Visit: Payer: BC Managed Care – PPO

## 2012-09-22 ENCOUNTER — Other Ambulatory Visit: Payer: Self-pay | Admitting: *Deleted

## 2012-09-22 ENCOUNTER — Other Ambulatory Visit: Payer: Self-pay | Admitting: Specialist

## 2012-09-22 DIAGNOSIS — M75100 Unspecified rotator cuff tear or rupture of unspecified shoulder, not specified as traumatic: Secondary | ICD-10-CM

## 2012-09-24 ENCOUNTER — Other Ambulatory Visit: Payer: BC Managed Care – PPO

## 2012-09-30 ENCOUNTER — Ambulatory Visit
Admission: RE | Admit: 2012-09-30 | Discharge: 2012-09-30 | Disposition: A | Discharge status: Home / Self Care | Payer: PRIVATE HEALTH INSURANCE | Source: Ambulatory Visit | Attending: *Deleted | Admitting: *Deleted

## 2012-09-30 DIAGNOSIS — M75100 Unspecified rotator cuff tear or rupture of unspecified shoulder, not specified as traumatic: Secondary | ICD-10-CM

## 2012-11-23 ENCOUNTER — Telehealth: Payer: Self-pay

## 2012-11-23 MED ORDER — NORETHINDRONE-ETH ESTRADIOL 1-35 MG-MCG PO TABS: ORAL_TABLET | ORAL | Status: DC

## 2012-11-23 NOTE — Telephone Encounter (Signed)
Left voicemail requesting pt to call office 

## 2012-11-23 NOTE — Telephone Encounter (Signed)
Looks like she was on cyclafem continuously - ask if that is correct with her and then if so, refill for a year, thanks

## 2012-11-23 NOTE — Telephone Encounter (Signed)
Pt wanted to know if there is any test/blood work she can get to test to see if she is going or about to go through menopause, also I sent in a 1 month supply Rx for [facility] pill to local pharmacy, because pt would like to get her Rx through mail order, her 1st Rx with them needs to be a paper Rx so pt needs a paper Rx for [facility] and get 3 months at a time, I advise pt you are out of office today but we will call tomorrow when her Rx is ready for pick-up

## 2012-11-23 NOTE — Telephone Encounter (Signed)
Pt left v/m requesting cb [id number].

## 2012-11-23 NOTE — Telephone Encounter (Signed)
Pt left v/m requesting call back. 

## 2012-11-23 NOTE — Telephone Encounter (Signed)
Pt left v/m; pt stopped BC pill to see if in menopause when seen 09/*25/13; since 02/2012 pt has had 2-3 periods. Pt wants to restart BC pill; will Dr Milinda Antis refill [facility] or can pt get name of BC pill pt was taking to give to her GYN.[facility] [address]..Please advise.

## 2012-11-23 NOTE — Telephone Encounter (Signed)
Will do px tomorrow- so send this back to me  When she has stopped getting her period (off the pill) for 3 or more months I can do a lab test [facility]) to see if she is in menopause yet

## 2012-11-24 MED ORDER — NORETHINDRONE-ETH ESTRADIOL 1-35 MG-MCG PO TABS: ORAL_TABLET | ORAL | Status: DC

## 2012-11-24 NOTE — Telephone Encounter (Signed)
Done and in IN box 

## 2012-11-24 NOTE — Telephone Encounter (Signed)
See prev note, please do paper Rx for [facility]

## 2012-11-24 NOTE — Telephone Encounter (Signed)
Left voicemail letting pt know Rx ready for pick-up and that she has to be off BC pill and no period for 3 months before we can do lab work  (okay to leave voicemail per pt)

## 2013-01-05 ENCOUNTER — Ambulatory Visit (INDEPENDENT_AMBULATORY_CARE_PROVIDER_SITE_OTHER): Payer: PRIVATE HEALTH INSURANCE | Admitting: Family Medicine

## 2013-01-05 ENCOUNTER — Encounter: Payer: Self-pay | Admitting: Family Medicine

## 2013-01-05 VITALS — BP 108/68 | HR 81 | Temp 98.4°F | Ht 67.0 in | Wt 166.0 lb

## 2013-01-05 DIAGNOSIS — N951 Menopausal and female climacteric states: Secondary | ICD-10-CM | POA: Insufficient documentation

## 2013-01-05 DIAGNOSIS — N920 Excessive and frequent menstruation with regular cycle: Secondary | ICD-10-CM

## 2013-01-05 NOTE — Progress Notes (Signed)
Subjective:    Patient ID: Meredith [name], female    DOB: 08-31-1960, 52 y.o.   MRN: [id number]  HPI Here for a personal issue   Menopause? Stopped her OC in oct - stopped until may- started  LMP- was early may- has been bleeding since then (minus several days) Then re started her pill (the 1/35)- was supposed to take continuous   Has been on the OC now since 6/12 -- and she has not stopped bleeding (has missed a pill occasionally) Has bled almost every day - some days lighter than others  Is having cramps almost every day - is miserable Not really irritable   She sees - Dr Doristine Church for gyn   Lost 26 lb since last visit - and took phenteramine (short term) and very happy with that   Patient Active Problem List   Diagnosis Date Noted  . Lumbar degenerative disc disease 03/08/2012  . URI (upper respiratory infection) 09/06/2011  . SOB (shortness of breath) 01/21/2011  . Chest pain 01/12/2011  . Hypocalcemia 01/12/2011  . Leukocytosis 01/12/2011  . Other screening mammogram 01/12/2011  . Gynecological examination 01/12/2011  . Routine general medical examination at a health care facility 01/03/2011  . Sciatica 11/20/2010  . Neck pain 11/20/2010  . PLANTAR FASCIITIS, BILATERAL 08/28/2009  . Pain in joint, lower leg 06/17/2009  . CHEST PAIN 12/28/2007  . KERATOSIS 05/25/2007  . HYPERCHOLESTEROLEMIA 10/13/2006  . ALLERGIC RHINITIS 10/13/2006  . GERD 10/13/2006  . IBS 10/13/2006  . DYSMENORRHEA 10/13/2006  . MIGRAINES, HX OF 10/13/2006   Past Medical History  Diagnosis Date  . Allergic rhinitis   . GERD (gastroesophageal reflux disease)   . Esophagitis   . IBS (irritable bowel syndrome)   . HLD (hyperlipidemia)   . Obesity   . Dysmenorrhea   . Pure hypercholesterolemia   . Acquired keratoderma   . Pain in joint, lower leg   . History of migraine headaches   . Plantar fascial fibromatosis   . Acute sinusitis, unspecified    Past Surgical History  Procedure  Laterality Date  . Treadmill stress test  01/13/11   History  Substance Use Topics  . Smoking status: Never Smoker   . Smokeless tobacco: Never Used  . Alcohol Use: Yes     Comment: Occasional   Family History  Problem Relation Age of Onset  . Heart attack Mother   . Hypertension Mother   . Lung cancer Father     + smoker  . GER disease Father     esophageal stricture  . Hypertension Brother   . Other Sister 85    MAC disease   No Known Allergies Current Outpatient Prescriptions on File Prior to Visit  Medication Sig Dispense Refill  . ibuprofen (ADVIL,MOTRIN) 200 MG tablet Take 200 mg by mouth every 6 (six) hours as needed.      . norethindrone-ethinyl estradiol 1/35 (CYCLAFEM 1/35) tablet TAKE 1 TABLET DAILY AS DIRECTED CONTINUOUSLY  3 Package  3  . calcium carbonate (TUMS - DOSED IN MG ELEMENTAL CALCIUM) 500 MG chewable tablet Chew 2 tablets by mouth every 2 (two) hours as needed.        No current facility-administered medications on file prior to visit.     Review of Systems Review of Systems  Constitutional: Negative for fever, appetite change, fatigue and unexpected weight change.  Eyes: Negative for pain and visual disturbance.  Respiratory: Negative for cough and shortness of breath.   Cardiovascular: Negative for  cp or palpitations    Gastrointestinal: Negative for nausea, diarrhea and constipation.  Genitourinary: Negative for urgency and frequency. pos for continuous menses with cramps / light at this time  Skin: Negative for pallor or rash   Neurological: Negative for weakness, light-headedness, numbness and headaches.  Hematological: Negative for adenopathy. Does not bruise/bleed easily.  Psychiatric/Behavioral: Negative for dysphoric mood. The patient is not nervous/anxious.         Objective:   Physical Exam  Constitutional: She appears well-developed and well-nourished. No distress.  Wt loss noted   HENT:  Head: Normocephalic and atraumatic.   Mouth/Throat: Oropharynx is clear and moist.  Eyes: Conjunctivae and EOM are normal. Pupils are equal, round, and reactive to light.  Neck: Normal range of motion. Neck supple. No thyromegaly present.  Cardiovascular: Normal rate and regular rhythm.   No murmur heard. Pulmonary/Chest: Effort normal and breath sounds normal.  Abdominal: Soft. Bowel sounds are normal. She exhibits no distension and no mass. There is no tenderness.  No suprapubic tenderness or fullness    Lymphadenopathy:    She has no cervical adenopathy.  Neurological: She is alert.  Skin: Skin is warm and dry. No pallor.  Psychiatric: She has a normal mood and affect.          Assessment & Plan:

## 2013-01-05 NOTE — Patient Instructions (Addendum)
Stop up front to get referral to gyn  Let me know if any problems

## 2013-01-07 NOTE — Assessment & Plan Note (Signed)
Perimenopausal bleeding - that is prolonged despite OC  Will ref to gyn to disc eval and tx options

## 2013-01-07 NOTE — Assessment & Plan Note (Signed)
Pt is having menorrhagia after months without menses - suspect perimenopausal bleeding  No imp with OC  Ref to gyn

## 2013-01-18 ENCOUNTER — Other Ambulatory Visit: Payer: Self-pay

## 2013-01-18 NOTE — Telephone Encounter (Signed)
Pt said [facility] [address] tells pt the insurance is not reading that pt is taking BC pill daily. [facility] wants to charge pt $ 30.00 and pt said it should be free. Pt request 3 month prescription to be picked to go to new mail order pharmacy- [facility]. Pt request call back when rx ready for pick up.

## 2013-01-19 MED ORDER — NORETHINDRONE-ETH ESTRADIOL 1-35 MG-MCG PO TABS: ORAL_TABLET | ORAL | Status: DC

## 2013-01-19 NOTE — Telephone Encounter (Signed)
Px printed for pick up in IN box  

## 2013-01-19 NOTE — Telephone Encounter (Signed)
Left voicemail letting pt know Rx ready for pick up 

## 2013-01-26 ENCOUNTER — Telehealth: Payer: Self-pay

## 2013-01-26 NOTE — Telephone Encounter (Signed)
The form asks to list alternative therapies that have failed - - please ask - I will hold the form on my desk, thanks

## 2013-01-26 NOTE — Telephone Encounter (Signed)
Pt left v/m requesting samples of Cyclafem; Prior auth needed for Cyclafem; form on Dr Royden Purl shelf. Pt request cb.

## 2013-01-31 NOTE — Telephone Encounter (Signed)
Insurance almost never covers constant dosage like that... I do not know if we will have any luck getting them to

## 2013-01-31 NOTE — Telephone Encounter (Signed)
Pt said her insurance cover's this medication it's the quantity (she does 21 days not 28 days)she is having a hard time getting approved, pt skips the placebo week because she doesn't want to start her period, pt said that she was having a lot of pain and problems with her period so her Gyn advised her to skip the placebo week and go to the next packet, pt hasn't tried any other BC pills since this is covered by insurance

## 2013-02-05 NOTE — Telephone Encounter (Signed)
Spoke with pt's insurance and they may be able to override the quantity if you fill out the form, please mare the form as urgent because pt will run out ot [facility] pills by this Thursday

## 2013-02-05 NOTE — Telephone Encounter (Signed)
PA faxed

## 2013-02-05 NOTE — Telephone Encounter (Signed)
Done and in IN box 

## 2013-02-08 NOTE — Telephone Encounter (Signed)
Approval letter received and place in your inbox for you to sign off on

## 2013-02-08 NOTE — Telephone Encounter (Signed)
Pt said [facility] will not let pt have Cyclafem. Spoke with Vernona Rieger at [place] and she did receive PA approval but pt had filled last on 01/25/13 and it is too early to pick up 21 day supply. Pt notified and voiced understanding. Pt will ck with pharmacy closer to time for refill.

## 2013-02-08 NOTE — Telephone Encounter (Signed)
Called pt's insurance and PA was approved, I left voicemail letting pt know Rx was approved and also advise pharmacy too.

## 2013-03-09 ENCOUNTER — Ambulatory Visit (INDEPENDENT_AMBULATORY_CARE_PROVIDER_SITE_OTHER): Payer: PRIVATE HEALTH INSURANCE | Admitting: Family Medicine

## 2013-03-09 ENCOUNTER — Encounter: Payer: Self-pay | Admitting: Family Medicine

## 2013-03-09 VITALS — BP 130/80 | HR 81 | Temp 98.5°F | Ht 67.0 in | Wt 159.5 lb

## 2013-03-09 DIAGNOSIS — R300 Dysuria: Secondary | ICD-10-CM

## 2013-03-09 DIAGNOSIS — N390 Urinary tract infection, site not specified: Secondary | ICD-10-CM

## 2013-03-09 LAB — POCT URINALYSIS DIPSTICK
Bilirubin, UA: NEGATIVE
Glucose, UA: NEGATIVE
Ketones, UA: NEGATIVE
Nitrite, UA: NEGATIVE
Spec Grav, UA: 1.015

## 2013-03-09 LAB — POCT UA - MICROSCOPIC ONLY

## 2013-03-09 MED ORDER — SULFAMETHOXAZOLE-TMP DS 800-160 MG PO TABS: 1.0000 | ORAL_TABLET | Freq: Two times a day (BID) | ORAL | Status: DC

## 2013-03-09 MED ORDER — NORETHINDRONE-ETH ESTRADIOL 1-35 MG-MCG PO TABS: ORAL_TABLET | ORAL | Status: DC

## 2013-03-09 NOTE — Patient Instructions (Addendum)
Push fluids, complete antibiotics.  Call if symptoms not resolved at completion of antibiotics.

## 2013-03-09 NOTE — Assessment & Plan Note (Signed)
UNcomplicated. Treat with sulfa tmp BID x 3 days.

## 2013-03-09 NOTE — Progress Notes (Signed)
  Subjective:    Patient ID: Meredith [name], female    DOB: Nov 25, 1960, 52 y.o.   MRN: [id number]  Dysuria  This is a new problem. The current episode started in the past 7 days. The problem has been gradually worsening. The quality of the pain is described as aching. The pain is mild. There has been no fever. She is sexually active. There is no history of pyelonephritis. Associated symptoms include frequency and hesitancy. Pertinent negatives include no flank pain, nausea, urgency or vomiting. Associated symptoms comments: Urinary pressure, not emptying bladder fully. She has tried NSAIDs for the symptoms. The treatment provided mild relief. There is no history of catheterization, kidney stones, recurrent UTIs, a single kidney, urinary stasis or a urological procedure.     Review of Systems  Gastrointestinal: Negative for nausea and vomiting.  Genitourinary: Positive for dysuria, hesitancy and frequency. Negative for urgency and flank pain.       Objective:   Physical Exam  Constitutional: Vital signs are normal. She appears well-developed and well-nourished. She is cooperative.  Non-toxic appearance. She does not appear ill. No distress.  HENT:  Head: Normocephalic.  Right Ear: Hearing, tympanic membrane, external ear and ear canal normal. Tympanic membrane is not erythematous, not retracted and not bulging.  Left Ear: Hearing, tympanic membrane, external ear and ear canal normal. Tympanic membrane is not erythematous, not retracted and not bulging.  Nose: No mucosal edema or rhinorrhea. Right sinus exhibits no maxillary sinus tenderness and no frontal sinus tenderness. Left sinus exhibits no maxillary sinus tenderness and no frontal sinus tenderness.  Mouth/Throat: Uvula is midline, oropharynx is clear and moist and mucous membranes are normal.  Eyes: Conjunctivae, EOM and lids are normal. Pupils are equal, round, and reactive to light. Lids are everted and swept, no foreign bodies  found.  Neck: Trachea normal and normal range of motion. Neck supple. Carotid bruit is not present. No mass and no thyromegaly present.  Cardiovascular: Normal rate, regular rhythm, S1 normal, S2 normal, normal heart sounds, intact distal pulses and normal pulses.  Exam reveals no gallop and no friction rub.   No murmur heard. Pulmonary/Chest: Effort normal and breath sounds normal. Not tachypneic. No respiratory distress. She has no decreased breath sounds. She has no wheezes. She has no rhonchi. She has no rales.  Abdominal: Soft. Normal appearance and bowel sounds are normal. There is no tenderness. There is no CVA tenderness.  Neurological: She is alert.  Skin: Skin is warm, dry and intact. No rash noted.  Psychiatric: Her speech is normal and behavior is normal. Judgment and thought content normal. Her mood appears not anxious. Cognition and memory are normal. She does not exhibit a depressed mood.          Assessment & Plan:

## 2013-04-19 ENCOUNTER — Other Ambulatory Visit: Payer: Self-pay

## 2013-05-17 ENCOUNTER — Ambulatory Visit (INDEPENDENT_AMBULATORY_CARE_PROVIDER_SITE_OTHER): Payer: PRIVATE HEALTH INSURANCE | Admitting: Family Medicine

## 2013-05-17 ENCOUNTER — Encounter: Payer: Self-pay | Admitting: Family Medicine

## 2013-05-17 VITALS — BP 122/80 | HR 76 | Temp 97.9°F | Ht 67.0 in | Wt 155.8 lb

## 2013-05-17 DIAGNOSIS — R42 Dizziness and giddiness: Secondary | ICD-10-CM

## 2013-05-17 MED ORDER — MECLIZINE HCL 25 MG PO CHEW: 25.0000 mg | CHEWABLE_TABLET | Freq: Three times a day (TID) | ORAL | Status: DC | PRN

## 2013-05-17 NOTE — Assessment & Plan Note (Signed)
Anticipate peripheral vertigo - ?vestibular neuritis. Treat with meclizine and rest and fluids.  Update if sxs worsening or not improving as expected, consider head imaging. Normal orthostatics.  nonfocal neurological exam today. No significant risk factors for CVA - bp normal today.

## 2013-05-17 NOTE — Progress Notes (Addendum)
Patient seen and examined with PA student Ricarda Frame.  Note reviewed, agree with assessment and plan unless changes documented in my note.  CC: vertigo   Dizziness started last night when she woke up to get another blanket - describes "rush" like movement and "room spinning".  Worse this morning when she awoke.  Lightheaded and nauseated.  No vomiting. Change in position worsens dizziness L>R.  Better when laying down. Constant dizziness since then with position changes.  Improves with rest.  Does feel fullness sensation at occipital region. Has had several weeks of mild rhinorrhea.  Staying well hydrated. No new meds. Occasional EtOH use.  No smoking. Never had similar episodes. She is on OCPs. H/o migraines in past. None recently.  Denies tinnitus, headaches, palpitations, chest pain, dyspnea, ear pain, hearing changes, sinus pressure, fevers/chills, vision changes, sneezing or coughing.  Past Medical History  Diagnosis Date  . Allergic rhinitis   . GERD (gastroesophageal reflux disease)   . Esophagitis   . IBS (irritable bowel syndrome)   . HLD (hyperlipidemia)   . Obesity   . Dysmenorrhea   . Pure hypercholesterolemia   . Acquired keratoderma   . Pain in joint, lower leg   . History of migraine headaches   . Plantar fascial fibromatosis   . Acute sinusitis, unspecified     PE:  GEN: WDWN, CF, uncomfortable appearing from dizziness. HEENT: PERRLA, EOMI, TMs covered from cerumen. Optic disc borders seem defined bilaterally. CVS: nl s1/ s2, no m/r/g. Pulm: CTAB, no crackles/ wheezing. Neuro: CN 2-12 intact, normal gait. Neg dix hallpike maneuver bilaterally but she does feel unsteady when she goes from laying to sitting position.  Slight unsteadiness with romberg, no pronator drift.  Normal FTN.

## 2013-05-17 NOTE — Progress Notes (Signed)
   Subjective:    Patient ID: Meredith [name], female    DOB: 1960-08-31, 52 y.o.   MRN: [id number]  HPI CC: dizzy  Patient states dizziness began last night when she sat up in bed to get more blanket. She felt a "rush" and became nauseous with no vomiting. Difficulty walking with unsteadiness this morning due to dizziness. Dizziness is described as room spinning around her. Changing positions worsens dizziness while laying flat relieves dizziness but not completely. Complains of associated hot feeling with standing, nausea, and "heaviness" to occipital region (but no pain). Dizziness has been constant since last night. This has never happened before. Has had a few weeks of mild rhinorrhea which she attributes to allergies. No recent viral illness. Feels is well hydrated. No new medications.   Denies hearing changes, ringing in the ears, headaches, palpitations, chest pain, SOB, congestion, ear pain, sinus pressure, fever, chills, changes in vision, neck pain.   Hx migraines, last was 2 months ago.   No drug or tobacco use. Ocassional alcohol use, last drink Monday.   Flu shot in October  Review of Systems Per HPI    Objective:   Physical Exam  Constitutional: She is oriented to person, place, and time. She appears well-developed and well-nourished. No distress.  HENT:  Head: Normocephalic and atraumatic.  Right Ear: Tympanic membrane, external ear and ear canal normal.  Left Ear: Tympanic membrane, external ear and ear canal normal.  Nose: Nose normal. Right sinus exhibits no maxillary sinus tenderness and no frontal sinus tenderness. Left sinus exhibits no maxillary sinus tenderness and no frontal sinus tenderness.  Mouth/Throat: Oropharynx is clear and moist. No oropharyngeal exudate, posterior oropharyngeal edema, posterior oropharyngeal erythema or tonsillar abscesses.  No tenderness to palpation over occipital region.   Eyes: Conjunctivae and EOM are normal. Pupils are equal,  round, and reactive to light. Right eye exhibits no discharge. Left eye exhibits no discharge.  No nystagmus  Neck: Normal range of motion.  Cardiovascular: Normal rate, regular rhythm and normal heart sounds.  Exam reveals no gallop and no friction rub.   No murmur heard. Pulmonary/Chest: Effort normal and breath sounds normal. No respiratory distress. She has no wheezes.  Neurological: She is alert and oriented to person, place, and time. Coordination normal.  CN2-12 grossly intact. Normal gait Negative Dix-hallpike maneuver.  Negative romberg Negative pronator drift.   Skin: Skin is warm and dry. She is not diaphoretic. No erythema.      Assessment & Plan:  Peripheral vertigo Meclazine as needed for dizziness and nausea.  Rest and stay well hydrated.  Contact us if symptoms fail to improve, symptoms worsen, or if any new symptoms develop.

## 2013-05-17 NOTE — Progress Notes (Signed)
Pre-visit discussion using our clinic review tool. No additional management support is needed unless otherwise documented below in the visit note.  

## 2013-05-17 NOTE — Patient Instructions (Addendum)
I think you may have vestibular neuritis - treat with rest and fluids, and meclizine for nausea/dizziness as needed. Let us know if symptoms worsen or fail to improve with above for further evaluation Meclizine is over the counter- look for chewable variety.  Vertigo Vertigo means you feel like you or your surroundings are moving when they are not. Vertigo can be dangerous if it occurs when you are at work, driving, or performing difficult activities.  CAUSES  Vertigo occurs when there is a conflict of signals sent to your brain from the visual and sensory systems in your body. There are many different causes of vertigo, including:  Infections, especially in the inner ear.  A bad reaction to a drug or misuse of alcohol and medicines.  Withdrawal from drugs or alcohol.  Rapidly changing positions, such as lying down or rolling over in bed.  A migraine headache.  Decreased blood flow to the brain.  Increased pressure in the brain from a head injury, infection, tumor, or bleeding. SYMPTOMS  You may feel as though the world is spinning around or you are falling to the ground. Because your balance is upset, vertigo can cause nausea and vomiting. You may have involuntary eye movements (nystagmus). DIAGNOSIS  Vertigo is usually diagnosed by physical exam. If the cause of your vertigo is unknown, your caregiver may perform imaging tests, such as an MRI scan (magnetic resonance imaging). TREATMENT  Most cases of vertigo resolve on their own, without treatment. Depending on the cause, your caregiver may prescribe certain medicines. If your vertigo is related to body position issues, your caregiver may recommend movements or procedures to correct the problem. In rare cases, if your vertigo is caused by certain inner ear problems, you may need surgery. HOME CARE INSTRUCTIONS   Follow your caregiver's instructions.  Avoid driving.  Avoid operating heavy machinery.  Avoid performing any tasks  that would be dangerous to you or others during a vertigo episode.  Tell your caregiver if you notice that certain medicines seem to be causing your vertigo. Some of the medicines used to treat vertigo episodes can actually make them worse in some people. SEEK IMMEDIATE MEDICAL CARE IF:   Your medicines do not relieve your vertigo or are making it worse.  You develop problems with talking, walking, weakness, or using your arms, hands, or legs.  You develop severe headaches.  Your nausea or vomiting continues or gets worse.  You develop visual changes.  A family member notices behavioral changes.  Your condition gets worse. MAKE SURE YOU:  Understand these instructions.  Will watch your condition.  Will get help right away if you are not doing well or get worse. Document Released: 03/10/2005 Document Revised: 08/23/2011 Document Reviewed: 12/17/2010 [facility] Patient Information 2014 [place], [place].

## 2013-12-19 ENCOUNTER — Other Ambulatory Visit: Payer: Self-pay | Admitting: Family Medicine

## 2014-01-31 ENCOUNTER — Telehealth: Payer: Self-pay | Admitting: Emergency Medicine

## 2014-01-31 NOTE — Telephone Encounter (Signed)
Patient had labs about a week ago for Dr Raliegh Ip. Raliegh Ip. And would like to know if those labs could be used for CPX labs instead of getting new labs done.

## 2014-02-01 NOTE — Telephone Encounter (Signed)
Just bring a copy with you- we will review them at your visit and if there is anything I need that is not included be can draw it then

## 2014-02-01 NOTE — Telephone Encounter (Signed)
Left voicemail requesting pt to call office back on Monday

## 2014-02-06 ENCOUNTER — Encounter: Payer: Self-pay | Admitting: Family Medicine

## 2014-02-06 ENCOUNTER — Ambulatory Visit (INDEPENDENT_AMBULATORY_CARE_PROVIDER_SITE_OTHER): Payer: PRIVATE HEALTH INSURANCE | Admitting: Family Medicine

## 2014-02-06 VITALS — BP 128/78 | HR 82 | Temp 98.5°F | Ht 67.0 in | Wt 174.2 lb

## 2014-02-06 DIAGNOSIS — H6121 Impacted cerumen, right ear: Secondary | ICD-10-CM | POA: Insufficient documentation

## 2014-02-06 DIAGNOSIS — H6980 Other specified disorders of Eustachian tube, unspecified ear: Secondary | ICD-10-CM | POA: Insufficient documentation

## 2014-02-06 DIAGNOSIS — H6983 Other specified disorders of Eustachian tube, bilateral: Secondary | ICD-10-CM

## 2014-02-06 DIAGNOSIS — H6120 Impacted cerumen, unspecified ear: Secondary | ICD-10-CM

## 2014-02-06 NOTE — Assessment & Plan Note (Signed)
[place] Also allergy drainage  Recommend flonase ns otc 2 wk udpate if worse or no imp

## 2014-02-06 NOTE — Progress Notes (Signed)
Pre visit review using our clinic review tool, if applicable. No additional management support is needed unless otherwise documented below in the visit note. 

## 2014-02-06 NOTE — Progress Notes (Signed)
Subjective:    Patient ID: Meredith [name], female    DOB: [birth year], 53 y.o.   MRN: [id number]  HPI Here with ear problem  [place] - and the [place] feel full Really muffled   Harder to hear  No pain / just pressure   Keeps a little allergy drainage all the time No facial pain   Patient Active Problem List   Diagnosis Date Noted  . Vertigo 05/17/2013  . UTI (urinary tract infection) 03/09/2013  . Perimenopause 01/05/2013  . Menorrhagia 01/05/2013  . Lumbar degenerative disc disease 03/08/2012  . URI (upper respiratory infection) 09/06/2011  . SOB (shortness of breath) 01/21/2011  . Chest pain 01/12/2011  . Hypocalcemia 01/12/2011  . Leukocytosis 01/12/2011  . Other screening mammogram 01/12/2011  . Gynecological examination 01/12/2011  . Routine general medical examination at a health care facility 01/03/2011  . Sciatica 11/20/2010  . Neck pain 11/20/2010  . PLANTAR FASCIITIS, BILATERAL 08/28/2009  . Pain in joint, lower leg 06/17/2009  . CHEST PAIN 12/28/2007  . KERATOSIS 05/25/2007  . HYPERCHOLESTEROLEMIA 10/13/2006  . ALLERGIC RHINITIS 10/13/2006  . GERD 10/13/2006  . IBS 10/13/2006  . DYSMENORRHEA 10/13/2006  . MIGRAINES, HX OF 10/13/2006   Past Medical History  Diagnosis Date  . Allergic rhinitis   . GERD (gastroesophageal reflux disease)   . Esophagitis   . IBS (irritable bowel syndrome)   . HLD (hyperlipidemia)   . Obesity   . Dysmenorrhea   . Pure hypercholesterolemia   . Acquired keratoderma   . Pain in joint, lower leg   . History of migraine headaches   . Plantar fascial fibromatosis   . Acute sinusitis, unspecified    Past Surgical History  Procedure Laterality Date  . Treadmill stress test  01/13/11   History  Substance Use Topics  . Smoking status: Never Smoker   . Smokeless tobacco: Never Used  . Alcohol Use: Yes     Comment: Occasional   Family History  Problem Relation Age of Onset  .  Heart attack Mother   . Hypertension Mother   . Lung cancer Father     + smoker  . GER disease Father     esophageal stricture  . Hypertension Brother   . Other Sister 71    MAC disease   No Known Allergies Current Outpatient Prescriptions on File Prior to Visit  Medication Sig Dispense Refill  . CYCLAFEM 1/35 tablet TAKE 1 TABLET DAILY AS DIRECTED CONTINUOUSLY  4 Package  0   No current facility-administered medications on file prior to visit.    Review of Systems    Review of Systems  Constitutional: Negative for fever, appetite change, fatigue and unexpected weight change.  Eyes: Negative for pain and visual disturbance.  ENT pos for ear fullness/muffled hearing/neg for pain or ear drainage/neg for sinus pain  Respiratory: Negative for cough and shortness of breath.   Cardiovascular: Negative for cp or palpitations    Gastrointestinal: Negative for nausea, diarrhea and constipation.  Genitourinary: Negative for urgency and frequency.  Skin: Negative for pallor or rash   Neurological: Negative for weakness, light-headedness, numbness and headaches.  Hematological: Negative for adenopathy. Does not bruise/bleed easily.  Psychiatric/Behavioral: Negative for dysphoric mood. The patient is not nervous/anxious.      Objective:   Physical Exam  Constitutional: She appears well-developed and well-nourished. No distress.  HENT:  Head: Normocephalic and atraumatic.  Mouth/Throat: Oropharynx is clear and moist.  No oropharyngeal exudate.  R canal- total cerumen impaction - completely cleared with simple irrigation , TM clear  L ear canal clear / TM slt dull   No external ear tenderness  Nares boggy No sinus tenderness  Eyes: Conjunctivae and EOM are normal. Pupils are equal, round, and reactive to light. Right eye exhibits no discharge. Left eye exhibits no discharge.  Neck: Normal range of motion. Neck supple.  Lymphadenopathy:    She has no cervical adenopathy.    Neurological: She is alert. No cranial nerve deficit.  Skin: Skin is warm and dry. No rash noted. No erythema. No pallor.  Psychiatric: She has a normal mood and affect.          Assessment & Plan:   Problem List Items Addressed This Visit     Nervous and Auditory   Impacted cerumen of right ear - Primary     Resolved completely with simple ear irrigation Relief obtained  Will update if any problems     ETD (eustachian tube dysfunction)     [place] Also allergy drainage  Recommend flonase ns otc 2 wk udpate if worse or no imp

## 2014-02-06 NOTE — Telephone Encounter (Signed)
Pt came in for an acute visit. Labs were given to me and placed in my box until her CPE, lab appt cancelled

## 2014-02-06 NOTE — Patient Instructions (Signed)
I think you have some eustachian tube dysfunction from allergies and also travel to different altitude Get over the counter flonase nasal spray  - use as directed for 2 weeks  If no improvement let me know

## 2014-02-06 NOTE — Assessment & Plan Note (Signed)
Resolved completely with simple ear irrigation Relief obtained  Will update if any problems

## 2014-02-21 ENCOUNTER — Other Ambulatory Visit: Payer: Self-pay | Admitting: Family Medicine

## 2014-03-08 ENCOUNTER — Other Ambulatory Visit: Payer: PRIVATE HEALTH INSURANCE

## 2014-03-15 ENCOUNTER — Encounter: Payer: PRIVATE HEALTH INSURANCE | Admitting: Family Medicine

## 2014-03-18 ENCOUNTER — Encounter: Payer: PRIVATE HEALTH INSURANCE | Admitting: Family Medicine

## 2014-03-25 ENCOUNTER — Encounter: Payer: Self-pay | Admitting: Family Medicine

## 2014-03-25 ENCOUNTER — Ambulatory Visit (INDEPENDENT_AMBULATORY_CARE_PROVIDER_SITE_OTHER): Payer: PRIVATE HEALTH INSURANCE | Admitting: Family Medicine

## 2014-03-25 VITALS — BP 104/74 | HR 80 | Temp 98.6°F | Ht 67.0 in | Wt 170.8 lb

## 2014-03-25 DIAGNOSIS — N924 Excessive bleeding in the premenopausal period: Secondary | ICD-10-CM

## 2014-03-25 DIAGNOSIS — Z0000 Encounter for general adult medical examination without abnormal findings: Secondary | ICD-10-CM

## 2014-03-25 DIAGNOSIS — E7800 Pure hypercholesterolemia, unspecified: Secondary | ICD-10-CM

## 2014-03-25 MED ORDER — NORETHINDRONE-ETH ESTRADIOL 1-35 MG-MCG PO TABS: ORAL_TABLET | ORAL | Status: DC

## 2014-03-25 NOTE — Patient Instructions (Signed)
Let's keep you on the oral contraceptive for another year- and reassess  Will do gyn exam next year  Don't forget to schedule you mammogram  Keep up with your dermatology appointments  Get out and walk

## 2014-03-25 NOTE — Progress Notes (Signed)
Pre visit review using our clinic review tool, if applicable. No additional management support is needed unless otherwise documented below in the visit note. 

## 2014-03-25 NOTE — Assessment & Plan Note (Signed)
Disc goals for lipids and reasons to control them Rev labs with pt Rev low sat fat diet in detail  Overall fairly good control with diet

## 2014-03-25 NOTE — Assessment & Plan Note (Signed)
No bleeding on continuous OC -doing well  Has not succeeded in stopping it without bleeding  No breakthrough bleeding Will continue for the next year - do gyn/pap exam at next wellness exam  Then disc trial off of it again

## 2014-03-25 NOTE — Progress Notes (Signed)
Subjective:    Patient ID: Meredith [name], female    DOB: 1961/03/30, 53 y.o.   MRN: [id number]  HPI Here for health maintenance exam and to review chronic medical problems    Doing well overall  Nothing new going on  Wt is down 4 lb with bmi of 26 Trying to loose weight - watching what she eats  Will be able to exercise with better weather    Mammogram 8/12- she says she has had some since - at [facility]  Self exam- no lumps or changes   Flu shot- will get at work on 10/15  pap 7/13 nl  No hx of abn paps  She is taking the pill continuously now cyclafem 1/35  Tried to stop it last year and had horrible periods  Wants to wait one more year to withdraw it again  No hot flashes or menopausal symptoms   Td 9/07  colonosc 9/09- normal - 10 year recall  No colon cancer in family    Labs: had at Dr Georga Bora  She sees her for wt loss (not gyn) cmet nl - had glucose of 101   Lipids : total 193 Trig 114, HDL 59, and LDL 111- pretty good overall  She is watching her diet   tsh 2.26  D level ok in 40s    Patient Active Problem List   Diagnosis Date Noted  . Impacted cerumen of right ear 02/06/2014  . ETD (eustachian tube dysfunction) 02/06/2014  . Vertigo 05/17/2013  . Perimenopause 01/05/2013  . Menorrhagia 01/05/2013  . Lumbar degenerative disc disease 03/08/2012  . SOB (shortness of breath) 01/21/2011  . Chest pain 01/12/2011  . Hypocalcemia 01/12/2011  . Leukocytosis 01/12/2011  . Other screening mammogram 01/12/2011  . Gynecological examination 01/12/2011  . Routine general medical examination at a health care facility 01/03/2011  . Sciatica 11/20/2010  . Neck pain 11/20/2010  . PLANTAR FASCIITIS, BILATERAL 08/28/2009  . Pain in joint, lower leg 06/17/2009  . KERATOSIS 05/25/2007  . HYPERCHOLESTEROLEMIA 10/13/2006  . ALLERGIC RHINITIS 10/13/2006  . GERD 10/13/2006  . IBS 10/13/2006  . DYSMENORRHEA 10/13/2006  . MIGRAINES, HX  OF 10/13/2006   Past Medical History  Diagnosis Date  . Allergic rhinitis   . GERD (gastroesophageal reflux disease)   . Esophagitis   . IBS (irritable bowel syndrome)   . HLD (hyperlipidemia)   . Obesity   . Dysmenorrhea   . Pure hypercholesterolemia   . Acquired keratoderma   . Pain in joint, lower leg   . History of migraine headaches   . Plantar fascial fibromatosis   . Acute sinusitis, unspecified    Past Surgical History  Procedure Laterality Date  . Treadmill stress test  01/13/11   History  Substance Use Topics  . Smoking status: Never Smoker   . Smokeless tobacco: Never Used  . Alcohol Use: Yes     Comment: Occasional   Family History  Problem Relation Age of Onset  . Heart attack Mother   . Hypertension Mother   . Lung cancer Father     + smoker  . GER disease Father     esophageal stricture  . Hypertension Brother   . Other Sister 66    MAC disease   No Known Allergies Current Outpatient Prescriptions on File Prior to Visit  Medication Sig Dispense Refill  . CYCLAFEM 1/35 tablet TAKE 1 TABLET DAILY AS DIRECTED CONTINUOUSLY  4 Package  0   No  current facility-administered medications on file prior to visit.        Review of Systems    Review of Systems  Constitutional: Negative for fever, appetite change, fatigue and unexpected weight change.  Eyes: Negative for pain and visual disturbance.  Respiratory: Negative for cough and shortness of breath.   Cardiovascular: Negative for cp or palpitations    Gastrointestinal: Negative for nausea, diarrhea and constipation.  Genitourinary: Negative for urgency and frequency.  Skin: Negative for pallor or rash   Neurological: Negative for weakness, light-headedness, numbness and headaches.  Hematological: Negative for adenopathy. Does not bruise/bleed easily.  Psychiatric/Behavioral: Negative for dysphoric mood. The patient is not nervous/anxious.      Objective:   Physical Exam  Constitutional: She  appears well-developed and well-nourished. No distress.  HENT:  Head: Normocephalic and atraumatic.  Right Ear: External ear normal.  Left Ear: External ear normal.  Mouth/Throat: Oropharynx is clear and moist.  Eyes: Conjunctivae and EOM are normal. Pupils are equal, round, and reactive to light. No scleral icterus.  Neck: Normal range of motion. Neck supple. No JVD present. Carotid bruit is not present. No thyromegaly present.  Cardiovascular: Normal rate, regular rhythm, normal heart sounds and intact distal pulses.  Exam reveals no gallop.   Pulmonary/Chest: Effort normal and breath sounds normal. No respiratory distress. She has no wheezes. She exhibits no tenderness.  Abdominal: Soft. Bowel sounds are normal. She exhibits no distension, no abdominal bruit and no mass. There is no tenderness.  Genitourinary: No breast swelling, tenderness, discharge or bleeding.  Breast exam: No mass, nodules, thickening, tenderness, bulging, retraction, inflamation, nipple discharge or skin changes noted.  No axillary or clavicular LA.      Musculoskeletal: Normal range of motion. She exhibits no edema and no tenderness.  Lymphadenopathy:    She has no cervical adenopathy.  Neurological: She is alert. She has normal reflexes. No cranial nerve deficit. She exhibits normal muscle tone. Coordination normal.  Skin: Skin is warm and dry. No rash noted. No erythema. No pallor.  Solar lentigos diffusely   Psychiatric: She has a normal mood and affect.          Assessment & Plan:   Problem List Items Addressed This Visit     Other   HYPERCHOLESTEROLEMIA - Primary     Disc goals for lipids and reasons to control them Rev labs with pt Rev low sat fat diet in detail  Overall fairly good control with diet     Routine general medical examination at a health care facility     Reviewed health habits including diet and exercise and skin cancer prevention Reviewed appropriate screening tests for age    Also reviewed health mt list, fam hx and immunization status , as well as social and family history   Rev labs from [facility] Pt will schedule her own mammogram     Menorrhagia     No bleeding on continuous OC -doing well  Has not succeeded in stopping it without bleeding  No breakthrough bleeding Will continue for the next year - do gyn/pap exam at next wellness exam  Then disc trial off of it again

## 2014-03-25 NOTE — Assessment & Plan Note (Signed)
Reviewed health habits including diet and exercise and skin cancer prevention Reviewed appropriate screening tests for age  Also reviewed health mt list, fam hx and immunization status , as well as social and family history   Rev labs from [facility] Pt will schedule her own mammogram

## 2014-03-28 ENCOUNTER — Telehealth: Payer: Self-pay

## 2014-03-28 NOTE — Telephone Encounter (Signed)
Pt left v/m; pt received message from [place] that more info is needed from [facility] before BC pill can be filled; [facility] is supposed to send request for additional information. Pt request cb.

## 2014-04-15 ENCOUNTER — Encounter: Payer: Self-pay | Admitting: Family Medicine

## 2014-04-18 ENCOUNTER — Telehealth: Payer: Self-pay

## 2014-04-18 NOTE — Telephone Encounter (Signed)
Pt left v/m requestingprinted BC rx refaxed to [place] fax # [phone] with ref # on rx [id number].

## 2014-04-19 MED ORDER — NORETHINDRONE-ETH ESTRADIOL 1-35 MG-MCG PO TABS: ORAL_TABLET | ORAL | Status: DC

## 2014-04-19 NOTE — Telephone Encounter (Signed)
Rx faxed

## 2014-04-19 NOTE — Telephone Encounter (Signed)
Printed to fax  

## 2014-04-29 NOTE — Telephone Encounter (Signed)
PA form faxed and requested PA to be processed ASAP

## 2014-04-29 NOTE — Telephone Encounter (Signed)
Done and in IN box 

## 2014-04-29 NOTE — Telephone Encounter (Signed)
Just received PA form today, form placed in Dr. Marliss Coots inbox

## 2014-04-29 NOTE — Telephone Encounter (Addendum)
Pt left v/m; pt is out of cyclafem and pt request to call [facility] [phone] then request prior auth dept. Pt request cb when completed. Pt request call to be done today because pt does not do well when off med.

## 2014-05-02 ENCOUNTER — Encounter: Payer: Self-pay | Admitting: Family Medicine

## 2014-05-02 ENCOUNTER — Ambulatory Visit (INDEPENDENT_AMBULATORY_CARE_PROVIDER_SITE_OTHER): Payer: PRIVATE HEALTH INSURANCE | Admitting: Family Medicine

## 2014-05-02 VITALS — BP 128/86 | HR 83 | Temp 98.4°F | Wt 174.0 lb

## 2014-05-02 DIAGNOSIS — R05 Cough: Secondary | ICD-10-CM

## 2014-05-02 DIAGNOSIS — R059 Cough, unspecified: Secondary | ICD-10-CM

## 2014-05-02 MED ORDER — HYDROCODONE-HOMATROPINE 5-1.5 MG/5ML PO SYRP: 5.0000 mL | ORAL_SOLUTION | Freq: Three times a day (TID) | ORAL | Status: DC | PRN

## 2014-05-02 MED ORDER — AZITHROMYCIN 250 MG PO TABS: ORAL_TABLET | ORAL | Status: DC

## 2014-05-02 NOTE — Progress Notes (Signed)
Pre visit review using our clinic review tool, if applicable. No additional management support is needed unless otherwise documented below in the visit note.  Sx started about 10 days ago.  First noted cough and some head congestion.  Sx continued, lost her voice.  Later on her sx continued, worse at the end of the day.  Chest has been tight.  ST.  Sleep disrupted by nocturnal cough.  Not much sputum with the cough.   No fevers.  Some possible wheeze, not during the day.   R ear sx episodically, more pressure than pain.  Some rhinorrhea.   No facial pain.  No tooth pain.   Tired OTC robitussin med.    Meds, vitals, and allergies reviewed.   ROS: See HPI.  Otherwise, noncontributory.  GEN: nad, alert and oriented HEENT: mucous membranes moist, tm w/o erythema, nasal exam w/o erythema, clear discharge noted,  OP with cobblestoning, sinuses not ttp NECK: supple w/o LA CV: rrr.   PULM: ctab, no inc wob EXT: no edema SKIN: no acute rash

## 2014-05-02 NOTE — Patient Instructions (Signed)
Use hycodan for the cough and if you have a lot more sputum or a fever, then start zithromax.  Take care.  Glad to see you.

## 2014-05-03 DIAGNOSIS — R059 Cough, unspecified: Secondary | ICD-10-CM | POA: Insufficient documentation

## 2014-05-03 DIAGNOSIS — R051 Acute cough: Secondary | ICD-10-CM | POA: Insufficient documentation

## 2014-05-03 DIAGNOSIS — R05 Cough: Secondary | ICD-10-CM | POA: Insufficient documentation

## 2014-05-03 NOTE — Assessment & Plan Note (Signed)
If she could control the cough, she would likely improve. D/w pt, she agrees.  Nontoxic, ctab.  Use hycodan for now, hold zmax for now, will start abx if she has progressive sx.  She agrees.   Likely viral, she'll likely improve w/o need for the abx.  Supportive care o/w.

## 2014-05-07 ENCOUNTER — Other Ambulatory Visit: Payer: Self-pay

## 2014-05-07 MED ORDER — NORETHINDRONE-ETH ESTRADIOL 1-35 MG-MCG PO TABS: ORAL_TABLET | ORAL | Status: DC

## 2014-05-07 NOTE — Telephone Encounter (Signed)
Pt left v/m mail order pharmacy still processing the cyclafem order and pt request 1 month rx to [facility] [address]. Refill done.pt notified done.

## 2014-05-29 ENCOUNTER — Other Ambulatory Visit: Payer: Self-pay

## 2014-05-29 MED ORDER — NORETHINDRONE-ETH ESTRADIOL 1-35 MG-MCG PO TABS: ORAL_TABLET | ORAL | Status: DC

## 2014-05-29 NOTE — Telephone Encounter (Signed)
Pt request refill cyclafem to [facility] [address]; pt having problem getting from mail order pharmacy; advised sent electronically for 1 pkg with 1 additional refill.

## 2014-07-10 ENCOUNTER — Telehealth: Payer: Self-pay

## 2014-07-10 MED ORDER — SCOPOLAMINE 1 MG/3DAYS TD PT72: 1.0000 | MEDICATED_PATCH | TRANSDERMAL | Status: DC

## 2014-07-10 NOTE — Telephone Encounter (Signed)
Pt left v/m; pt will be going on 7 day cruise and pt gets motion sickness; pt request patch for motion sickness sent to [facility] on [address]. Please advise.

## 2014-07-10 NOTE — Telephone Encounter (Signed)
Will refill electronically  

## 2014-11-04 ENCOUNTER — Telehealth: Payer: Self-pay | Admitting: Family Medicine

## 2014-11-04 DIAGNOSIS — Z0000 Encounter for general adult medical examination without abnormal findings: Secondary | ICD-10-CM

## 2014-11-04 NOTE — Telephone Encounter (Signed)
-----   Message from Marchia Bond sent at 11/04/2014  4:09 PM EDT ----- Regarding: cpx labs Wed 5/25, need orders please :-) Please order  future cpx labs for pt's upcoming lab appt. Thanks Aniceto Boss

## 2014-11-06 ENCOUNTER — Other Ambulatory Visit (INDEPENDENT_AMBULATORY_CARE_PROVIDER_SITE_OTHER): Payer: PRIVATE HEALTH INSURANCE

## 2014-11-06 DIAGNOSIS — E785 Hyperlipidemia, unspecified: Secondary | ICD-10-CM

## 2014-11-06 DIAGNOSIS — Z0000 Encounter for general adult medical examination without abnormal findings: Secondary | ICD-10-CM | POA: Diagnosis not present

## 2014-11-06 LAB — COMPREHENSIVE METABOLIC PANEL
ALT: 17 U/L (ref 0–35)
AST: 19 U/L (ref 0–37)
Albumin: 4.1 g/dL (ref 3.5–5.2)
Alkaline Phosphatase: 44 U/L (ref 39–117)
BUN: 17 mg/dL (ref 6–23)
CO2: 26 meq/L (ref 19–32)
CREATININE: 0.8 mg/dL (ref 0.40–1.20)
Calcium: 8.9 mg/dL (ref 8.4–10.5)
Chloride: 104 mEq/L (ref 96–112)
GFR: 79.57 mL/min (ref >60.00)
GLUCOSE: 99 mg/dL (ref 70–99)
Potassium: 4.3 mEq/L (ref 3.5–5.1)
Sodium: 136 mEq/L (ref 135–145)
TOTAL PROTEIN: 6.9 g/dL (ref 6.0–8.3)
Total Bilirubin: 0.4 mg/dL (ref 0.2–1.2)

## 2014-11-06 LAB — LIPID PANEL
CHOL/HDL RATIO: 4
CHOLESTEROL: 215 mg/dL — AB (ref 0–200)
HDL: 58.2 mg/dL (ref >39.00)
LDL CALC: 124 mg/dL — AB (ref 0–99)
NONHDL: 156.8
Triglycerides: 162 mg/dL — ABNORMAL HIGH (ref 0.0–149.0)
VLDL: 32.4 mg/dL (ref 0.0–40.0)

## 2014-11-06 LAB — CBC WITH DIFFERENTIAL/PLATELET
Basophils Absolute: 0 10*3/uL (ref 0.0–0.1)
Basophils Relative: 0.6 % (ref 0.0–3.0)
Eosinophils Absolute: 0.1 10*3/uL (ref 0.0–0.7)
Eosinophils Relative: 1.1 % (ref 0.0–5.0)
HCT: 40.4 % (ref 36.0–46.0)
Hemoglobin: 13.6 g/dL (ref 12.0–15.0)
LYMPHS PCT: 42.1 % (ref 12.0–46.0)
Lymphs Abs: 3 10*3/uL (ref 0.7–4.0)
MCHC: 33.8 g/dL (ref 30.0–36.0)
MCV: 97.7 fl (ref 78.0–100.0)
Monocytes Absolute: 0.4 10*3/uL (ref 0.1–1.0)
Monocytes Relative: 5.9 % (ref 3.0–12.0)
NEUTROS ABS: 3.6 10*3/uL (ref 1.4–7.7)
NEUTROS PCT: 50.3 % (ref 43.0–77.0)
PLATELETS: 299 10*3/uL (ref 150.0–400.0)
RBC: 4.13 Mil/uL (ref 3.87–5.11)
RDW: 14.2 % (ref 11.5–15.5)
WBC: 7.2 10*3/uL (ref 4.0–10.5)

## 2014-11-06 LAB — TSH: TSH: 2.76 u[IU]/mL (ref 0.35–4.50)

## 2014-11-20 ENCOUNTER — Encounter: Payer: Self-pay | Admitting: Family Medicine

## 2014-11-20 ENCOUNTER — Ambulatory Visit (INDEPENDENT_AMBULATORY_CARE_PROVIDER_SITE_OTHER): Payer: PRIVATE HEALTH INSURANCE | Admitting: Family Medicine

## 2014-11-20 VITALS — BP 122/72 | HR 81 | Temp 98.4°F | Ht 66.25 in | Wt 176.5 lb

## 2014-11-20 DIAGNOSIS — E7800 Pure hypercholesterolemia, unspecified: Secondary | ICD-10-CM

## 2014-11-20 DIAGNOSIS — Z0000 Encounter for general adult medical examination without abnormal findings: Secondary | ICD-10-CM | POA: Diagnosis not present

## 2014-11-20 MED ORDER — NORETHINDRONE-ETH ESTRADIOL 1-35 MG-MCG PO TABS: ORAL_TABLET | ORAL | Status: DC

## 2014-11-20 NOTE — Assessment & Plan Note (Signed)
Reviewed health habits including diet and exercise and skin cancer prevention Reviewed appropriate screening tests for age  Also reviewed health mt list, fam hx and immunization status , as well as social and family history   See HPI Pt will schedule her own mammogram She will f/u with her gyn for pap/exam and disc of OC in regards to adv age/ menopause (pt does not think she is menopausal yet) Labs reviewed Enc to see derm for skin screenings and use sun block

## 2014-11-20 NOTE — Progress Notes (Signed)
Pre visit review using our clinic review tool, if applicable. No additional management support is needed unless otherwise documented below in the visit note. 

## 2014-11-20 NOTE — Assessment & Plan Note (Signed)
Lipids are improved from last check here with inc HDL  Diet and exercise are improved Disc goals for lipids and reasons to control them Rev labs with pt Rev low sat fat diet in detail

## 2014-11-20 NOTE — Progress Notes (Signed)
Subjective:    Patient ID: Meredith [name], female    DOB: 12-25-1960, 54 y.o.   MRN: [id number]  HPI Here for health maintenance exam and to review chronic medical problems    Has been feeling good overall   A little more tired lately - likely due to work schedule She started taking a gummy vitamin    Wt is up 2 lb with bmi of 28  BP Readings from Last 3 Encounters:  11/20/14 122/72  05/02/14 128/86  03/25/14 104/74    Hep C/ HIV screening -not interested in screening   Mammogram 8/14? -thinks /missed last one - gets at [facility] - may be interested in 3D mammogram  Self exam -no lumps or changes   Pap 7/13 - was with Dr Cherylann Banas  On OC - takes it without a break / had one small period on 5/19 - was very light  Last time she tried to stop the pill - had bad bleeding (about a year ago)  No hot flashes or night sweats   Flu shot - got one in Oct at work   Td 9/07  Colonoscopy 9/09 nl   Results for orders placed or performed in visit on 11/06/14  CBC with Differential/Platelet  Result Value Ref Range   WBC 7.2 4.0 - 10.5 K/uL   RBC 4.13 3.87 - 5.11 Mil/uL   Hemoglobin 13.6 12.0 - 15.0 g/dL   HCT 40.4 36.0 - 46.0 %   MCV 97.7 78.0 - 100.0 fl   MCHC 33.8 30.0 - 36.0 g/dL   RDW 14.2 11.5 - 15.5 %   Platelets 299.0 150.0 - 400.0 K/uL   Neutrophils Relative % 50.3 43.0 - 77.0 %   Lymphocytes Relative 42.1 12.0 - 46.0 %   Monocytes Relative 5.9 3.0 - 12.0 %   Eosinophils Relative 1.1 0.0 - 5.0 %   Basophils Relative 0.6 0.0 - 3.0 %   Neutro Abs 3.6 1.4 - 7.7 K/uL   Lymphs Abs 3.0 0.7 - 4.0 K/uL   Monocytes Absolute 0.4 0.1 - 1.0 K/uL   Eosinophils Absolute 0.1 0.0 - 0.7 K/uL   Basophils Absolute 0.0 0.0 - 0.1 K/uL  Comprehensive metabolic panel  Result Value Ref Range   Sodium 136 135 - 145 mEq/L   Potassium 4.3 3.5 - 5.1 mEq/L   Chloride 104 96 - 112 mEq/L   CO2 26 19 - 32 mEq/L   Glucose, Bld 99 70 - 99 mg/dL   BUN 17 6 - 23 mg/dL   Creatinine, Ser 0.80 0.40 -  1.20 mg/dL   Total Bilirubin 0.4 0.2 - 1.2 mg/dL   Alkaline Phosphatase 44 39 - 117 U/L   AST 19 0 - 37 U/L   ALT 17 0 - 35 U/L   Total Protein 6.9 6.0 - 8.3 g/dL   Albumin 4.1 3.5 - 5.2 g/dL   Calcium 8.9 8.4 - 10.5 mg/dL   GFR 79.57 >60.00 mL/min  Lipid panel  Result Value Ref Range   Cholesterol 215 (H) 0 - 200 mg/dL   Triglycerides 162.0 (H) 0.0 - 149.0 mg/dL   HDL 58.20 >39.00 mg/dL   VLDL 32.4 0.0 - 40.0 mg/dL   LDL Cholesterol 124 (H) 0 - 99 mg/dL   Total CHOL/HDL Ratio 4    NonHDL 156.80   TSH  Result Value Ref Range   TSH 2.76 0.35 - 4.50 uIU/mL      Controlling cholesterol with diet  No longer on medicine  Better with exercise also - HDL is great - walks quite a bit   Patient Active Problem List   Diagnosis Date Noted  . Cough 05/03/2014  . Impacted cerumen of right ear 02/06/2014  . ETD (eustachian tube dysfunction) 02/06/2014  . Vertigo 05/17/2013  . Perimenopause 01/05/2013  . Menorrhagia 01/05/2013  . Lumbar degenerative disc disease 03/08/2012  . SOB (shortness of breath) 01/21/2011  . Chest pain 01/12/2011  . Hypocalcemia 01/12/2011  . Leukocytosis 01/12/2011  . Other screening mammogram 01/12/2011  . Gynecological examination 01/12/2011  . Routine general medical examination at a health care facility 01/03/2011  . Sciatica 11/20/2010  . Neck pain 11/20/2010  . PLANTAR FASCIITIS, BILATERAL 08/28/2009  . Pain in joint, lower leg 06/17/2009  . KERATOSIS 05/25/2007  . HYPERCHOLESTEROLEMIA 10/13/2006  . ALLERGIC RHINITIS 10/13/2006  . GERD 10/13/2006  . IBS 10/13/2006  . DYSMENORRHEA 10/13/2006  . MIGRAINES, HX OF 10/13/2006   Past Medical History  Diagnosis Date  . Allergic rhinitis   . GERD (gastroesophageal reflux disease)   . Esophagitis   . IBS (irritable bowel syndrome)   . HLD (hyperlipidemia)   . Obesity   . Dysmenorrhea   . Pure hypercholesterolemia   . Acquired keratoderma   . Pain in joint, lower leg   . History of migraine  headaches   . Plantar fascial fibromatosis   . Acute sinusitis, unspecified    Past Surgical History  Procedure Laterality Date  . Treadmill stress test  01/13/11   History  Substance Use Topics  . Smoking status: Never Smoker   . Smokeless tobacco: Never Used  . Alcohol Use: 0.0 oz/week    0 Standard drinks or equivalent per week     Comment: Occasional   Family History  Problem Relation Age of Onset  . Heart attack Mother   . Hypertension Mother   . Lung cancer Father     + smoker  . GER disease Father     esophageal stricture  . Hypertension Brother   . Other Sister 33    MAC disease   No Known Allergies Current Outpatient Prescriptions on File Prior to Visit  Medication Sig Dispense Refill  . norethindrone-ethinyl estradiol 1/35 (CYCLAFEM 1/35) tablet TAKE 1 TABLET DAILY AS DIRECTED CONTINUOUSLY 1 Package 1   No current facility-administered medications on file prior to visit.    Review of Systems    Review of Systems  Constitutional: Negative for fever, appetite change, fatigue and unexpected weight change.  Eyes: Negative for pain and visual disturbance.  Respiratory: Negative for cough and shortness of breath.   Cardiovascular: Negative for cp or palpitations    Gastrointestinal: Negative for nausea, diarrhea and constipation.  Genitourinary: Negative for urgency and frequency.  Skin: Negative for pallor or rash   Neurological: Negative for weakness, light-headedness, numbness and headaches.  Hematological: Negative for adenopathy. Does not bruise/bleed easily.  Psychiatric/Behavioral: Negative for dysphoric mood. The patient is not nervous/anxious.      Objective:   Physical Exam  Constitutional: She appears well-developed and well-nourished. No distress.  Well appearing   HENT:  Head: Normocephalic and atraumatic.  Right Ear: External ear normal.  Left Ear: External ear normal.  Nose: Nose normal.  Mouth/Throat: Oropharynx is clear and moist.  Eyes:  Conjunctivae and EOM are normal. Pupils are equal, round, and reactive to light. Right eye exhibits no discharge. Left eye exhibits no discharge. No scleral icterus.  Neck: Normal range of motion. Neck supple. No JVD present.  Carotid bruit is not present. No thyromegaly present.  Cardiovascular: Normal rate, regular rhythm, normal heart sounds and intact distal pulses.  Exam reveals no gallop.   Pulmonary/Chest: Effort normal and breath sounds normal. No respiratory distress. She has no wheezes. She has no rales.  Abdominal: Soft. Bowel sounds are normal. She exhibits no distension and no mass. There is no tenderness.  Musculoskeletal: She exhibits no edema or tenderness.  Lymphadenopathy:    She has no cervical adenopathy.  Neurological: She is alert. She has normal reflexes. No cranial nerve deficit. She exhibits normal muscle tone. Coordination normal.  Skin: Skin is warm and dry. No rash noted. No erythema. No pallor.  Psychiatric: She has a normal mood and affect.          Assessment & Plan:   Problem List Items Addressed This Visit    HYPERCHOLESTEROLEMIA - Primary    Lipids are improved from last check here with inc HDL  Diet and exercise are improved Disc goals for lipids and reasons to control them Rev labs with pt Rev low sat fat diet in detail       Routine general medical examination at a health care facility    Reviewed health habits including diet and exercise and skin cancer prevention Reviewed appropriate screening tests for age  Also reviewed health mt list, fam hx and immunization status , as well as social and family history   See HPI Pt will schedule her own mammogram She will f/u with her gyn for pap/exam and disc of OC in regards to adv age/ menopause (pt does not think she is menopausal yet) Labs reviewed Enc to see derm for skin screenings and use sun block

## 2014-11-20 NOTE — Patient Instructions (Signed)
Don't forget your mammogram appointment  Also see your gyn - it's time for a pap and also to discuss long term plan for the oral contraception   Take care of yourself Wear your sun protection  Eat a healthy diet (Avoid red meat/ fried foods/ egg yolks/ fatty breakfast meats/ butter, cheese and high fat dairy/ and shellfish )

## 2014-12-20 ENCOUNTER — Other Ambulatory Visit: Payer: Self-pay | Admitting: Family Medicine

## 2014-12-20 DIAGNOSIS — Z1231 Encounter for screening mammogram for malignant neoplasm of breast: Secondary | ICD-10-CM

## 2014-12-23 ENCOUNTER — Ambulatory Visit
Admission: RE | Admit: 2014-12-23 | Discharge: 2014-12-23 | Disposition: A | Discharge status: Home / Self Care | Payer: PRIVATE HEALTH INSURANCE | Source: Ambulatory Visit | Attending: Family Medicine | Admitting: Family Medicine

## 2014-12-23 ENCOUNTER — Other Ambulatory Visit: Payer: Self-pay | Admitting: Family Medicine

## 2014-12-23 DIAGNOSIS — Z1231 Encounter for screening mammogram for malignant neoplasm of breast: Secondary | ICD-10-CM | POA: Diagnosis present

## 2014-12-23 HISTORY — DX: Malignant (primary) neoplasm, unspecified: C80.1

## 2015-05-21 ENCOUNTER — Encounter: Payer: Self-pay | Admitting: Podiatry

## 2015-05-21 ENCOUNTER — Ambulatory Visit (INDEPENDENT_AMBULATORY_CARE_PROVIDER_SITE_OTHER): Payer: PRIVATE HEALTH INSURANCE

## 2015-05-21 ENCOUNTER — Ambulatory Visit (INDEPENDENT_AMBULATORY_CARE_PROVIDER_SITE_OTHER): Payer: PRIVATE HEALTH INSURANCE | Admitting: Podiatry

## 2015-05-21 VITALS — BP 137/84 | HR 77 | Resp 18

## 2015-05-21 DIAGNOSIS — M2010 Hallux valgus (acquired), unspecified foot: Secondary | ICD-10-CM

## 2015-05-21 DIAGNOSIS — L603 Nail dystrophy: Secondary | ICD-10-CM | POA: Diagnosis not present
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
## 2015-05-21 NOTE — Patient Instructions (Signed)
Pre-Operative Instructions  Congratulations, you have decided to take an important step to improving your quality of life.  You can be assured that the doctors of [facility] will be with you every step of the way.  1. Plan to be at the surgery center/hospital at least 1 (one) hour prior to your scheduled time unless otherwise directed by the surgical center/hospital staff.  You must have a responsible adult accompany you, remain during the surgery and drive you home.  Make sure you have directions to the surgical center/hospital and know how to get there on time. 2. For hospital based surgery you will need to obtain a history and physical form from your family physician within 1 month prior to the date of surgery- we will give you a form for you primary physician.  3. We make every effort to accommodate the date you request for surgery.  There are however, times where surgery dates or times have to be moved.  We will contact you as soon as possible if a change in schedule is required.   4. No Aspirin/Ibuprofen for one week before surgery.  If you are on aspirin, any non-steroidal anti-inflammatory medications (Mobic, Aleve, Ibuprofen) you should stop taking it 7 days prior to your surgery.  You make take Tylenol  For pain prior to surgery.  5. Medications- If you are taking daily heart and blood pressure medications, seizure, reflux, allergy, asthma, anxiety, pain or diabetes medications, make sure the surgery center/hospital is aware before the day of surgery so they may notify you which medications to take or avoid the day of surgery. 6. No food or drink after midnight the night before surgery unless directed otherwise by surgical center/hospital staff. 7. No alcoholic beverages 24 hours prior to surgery.  No smoking 24 hours prior to or 24 hours after surgery. 8. Wear loose pants or shorts- loose enough to fit over bandages, boots, and casts. 9. No slip on shoes, sneakers are best. 10. Bring  your boot with you to the surgery center/hospital.  Also bring crutches or a walker if your physician has prescribed it for you.  If you do not have this equipment, it will be provided for you after surgery. 11. If you have not been contracted by the surgery center/hospital by the day before your surgery, call to confirm the date and time of your surgery. 12. Leave-time from work may vary depending on the type of surgery you have.  Appropriate arrangements should be made prior to surgery with your employer. 13. Prescriptions will be provided immediately following surgery by your doctor.  Have these filled as soon as possible after surgery and take the medication as directed. 14. Remove nail polish on the operative foot. 15. Wash the night before surgery.  The night before surgery wash the foot and leg well with the antibacterial soap provided and water paying special attention to beneath the toenails and in between the toes.  Rinse thoroughly with water and dry well with a towel.  Perform this wash unless told not to do so by your physician.  Enclosed: 1 Ice pack (please put in freezer the night before surgery)   1 Hibiclens skin cleaner   Pre-op Instructions  If you have any questions regarding the instructions, do not hesitate to call our office.  [place]: [address] [place], [place] [address] [phone]  [place]: [address] [place], [place] [address] [phone]  [place]: [address]  [place], [place] [address] [phone]  Dr. Richard   Tuchman DPM, Dr. Norman Regal DPM Dr. Richard Sikora DPM, Dr. M. Todd Hyatt DPM, Dr. Kathryn Egerton DPM 

## 2015-05-30 ENCOUNTER — Ambulatory Visit (INDEPENDENT_AMBULATORY_CARE_PROVIDER_SITE_OTHER): Payer: PRIVATE HEALTH INSURANCE | Admitting: Family Medicine

## 2015-05-30 ENCOUNTER — Encounter: Payer: Self-pay | Admitting: Family Medicine

## 2015-05-30 VITALS — BP 134/88 | HR 73 | Temp 98.0°F | Wt 190.0 lb

## 2015-05-30 DIAGNOSIS — G43001 Migraine without aura, not intractable, with status migrainosus: Secondary | ICD-10-CM

## 2015-05-30 DIAGNOSIS — G43909 Migraine, unspecified, not intractable, without status migrainosus: Secondary | ICD-10-CM | POA: Insufficient documentation

## 2015-05-30 MED ORDER — KETOROLAC TROMETHAMINE 30 MG/ML IJ SOLN
30.0000 mg | Freq: Once | INTRAMUSCULAR | Status: AC
  Administered 2015-05-30: 30 mg via INTRAMUSCULAR

## 2015-05-30 MED ORDER — SUMATRIPTAN 20 MG/ACT NA SOLN: 20.0000 mg | NASAL | Status: DC | PRN

## 2015-05-30 MED ORDER — PROMETHAZINE HCL 25 MG PO TABS: 25.0000 mg | ORAL_TABLET | Freq: Four times a day (QID) | ORAL | Status: DC | PRN

## 2015-05-30 MED ORDER — PROMETHAZINE HCL 25 MG/ML IJ SOLN
50.0000 mg | Freq: Once | INTRAMUSCULAR | Status: AC
  Administered 2015-05-30: 50 mg via INTRAMUSCULAR

## 2015-05-30 NOTE — Patient Instructions (Signed)
toradol injection now for pain  Phenergan injection now for nausea and headache as well  You can take oral phenergan as needed  Try imitrex nasal spray- one spray now - if not improved in 2 hours you can repeat it   Rest/ lie in dark room/ lots of fluids- take it easy today

## 2015-05-30 NOTE — Progress Notes (Signed)
Subjective:    Patient ID: DEMELZA [name], female    DOB: 04-01-1961, 54 y.o.   MRN: [id number]  HPI Here with a headache she cannot get rid of   Thinks it may be sinus related Some pressure in her ears   Took aleve last night -woke up and it was worse  Took a pill with hydrocodone-acet 5-300 (family just had dental surgery)- and it did not help  She stopped her OC last Wednesday - had her period and it was light  Started some vaginal bleeding again this am   No hx of menstrual ha  Her mother has hx of migraines   She has had headaches like this before   Nasal- a little congestion  No green or yellow d/c  Ear pressure (not pain)  Throat is ok  No fever  No cough  No neck stiffness  Headache is in temples and face and then travels to back of head  It does throb-esp if she bends over  A little nausea on and off -no vomiting  No vision changes  No aura that she knows of  Does have light sensitivity/not sound     Patient Active Problem List   Diagnosis Date Noted  . Migraine 05/30/2015  . Cough 05/03/2014  . Impacted cerumen of right ear 02/06/2014  . ETD (eustachian tube dysfunction) 02/06/2014  . Vertigo 05/17/2013  . Perimenopause 01/05/2013  . Menorrhagia 01/05/2013  . Lumbar degenerative disc disease 03/08/2012  . SOB (shortness of breath) 01/21/2011  . Chest pain 01/12/2011  . Hypocalcemia 01/12/2011  . Leukocytosis 01/12/2011  . Other screening mammogram 01/12/2011  . Gynecological examination 01/12/2011  . Routine general medical examination at a health care facility 01/03/2011  . Sciatica 11/20/2010  . Neck pain 11/20/2010  . PLANTAR FASCIITIS, BILATERAL 08/28/2009  . Pain in joint, lower leg 06/17/2009  . KERATOSIS 05/25/2007  . HYPERCHOLESTEROLEMIA 10/13/2006  . ALLERGIC RHINITIS 10/13/2006  . GERD 10/13/2006  . IBS 10/13/2006  . DYSMENORRHEA 10/13/2006  . MIGRAINES, HX OF 10/13/2006   Past Medical History  Diagnosis Date  .  Allergic rhinitis   . GERD (gastroesophageal reflux disease)   . Esophagitis   . IBS (irritable bowel syndrome)   . HLD (hyperlipidemia)   . Obesity   . Dysmenorrhea   . Pure hypercholesterolemia   . Acquired keratoderma   . Pain in joint, lower leg   . History of migraine headaches   . Plantar fascial fibromatosis   . Acute sinusitis, unspecified   . Cancer [facility])     skin   Past Surgical History  Procedure Laterality Date  . Treadmill stress test  01/13/11   Social History  Substance Use Topics  . Smoking status: Never Smoker   . Smokeless tobacco: Never Used  . Alcohol Use: 0.0 oz/week    0 Standard drinks or equivalent per week     Comment: Occasional   Family History  Problem Relation Age of Onset  . Heart attack Mother   . Hypertension Mother   . Lung cancer Father     + smoker  . GER disease Father     esophageal stricture  . Hypertension Brother   . Other Sister 53    MAC disease   No Known Allergies Current Outpatient Prescriptions on File Prior to Visit  Medication Sig Dispense Refill  . norethindrone-ethinyl estradiol 1/35 (CYCLAFEM 1/35) tablet TAKE 1 TABLET DAILY AS DIRECTED CONTINUOUSLY 3 Package 1   No current  facility-administered medications on file prior to visit.    Review of Systems    Review of Systems  Constitutional: Negative for fever, appetite change, fatigue and unexpected weight change.  Eyes: Negative for pain and visual disturbance.  ENT pos for mild cong w/o sinus pressure Respiratory: Negative for cough and shortness of breath.   Cardiovascular: Negative for cp or palpitations    Gastrointestinal: Negative for , diarrhea and constipation. neg for abd pain  Genitourinary: Negative for urgency and frequency.  Skin: Negative for pallor or rash   Neurological: Negative for weakness, light-headedness, numbness and headaches.  Hematological: Negative for adenopathy. Does not bruise/bleed easily.  Psychiatric/Behavioral: Negative for  dysphoric mood. The patient is not nervous/anxious.      Objective:   Physical Exam  Constitutional: She is oriented to person, place, and time. She appears well-developed and well-nourished. No distress.  obese and well appearing   HENT:  Head: Normocephalic and atraumatic.  Right Ear: External ear normal.  Left Ear: External ear normal.  Nose: Nose normal.  Mouth/Throat: Oropharynx is clear and moist. No oropharyngeal exudate.  No sinus tenderness No temporal tenderness  No TMJ tenderness  Eyes: Conjunctivae and EOM are normal. Pupils are equal, round, and reactive to light. Right eye exhibits no discharge. Left eye exhibits no discharge. No scleral icterus.  No nystagmus  Neck: Normal range of motion and full passive range of motion without pain. Neck supple. No JVD present. Carotid bruit is not present. No tracheal deviation present. No thyromegaly present.  Cardiovascular: Normal rate, regular rhythm and normal heart sounds.   No murmur heard. Pulmonary/Chest: Effort normal and breath sounds normal. No respiratory distress. She has no wheezes. She has no rales.  Abdominal: Soft. Bowel sounds are normal. She exhibits no distension and no mass. There is no tenderness.  Musculoskeletal: She exhibits no edema or tenderness.  Lymphadenopathy:    She has no cervical adenopathy.  Neurological: She is alert and oriented to person, place, and time. She has normal strength and normal reflexes. She displays no atrophy and no tremor. No cranial nerve deficit or sensory deficit. She exhibits normal muscle tone. She displays a negative Romberg sign. Coordination and gait normal.  No focal cerebellar signs   Skin: Skin is warm and dry. No rash noted. No pallor.  Psychiatric: She has a normal mood and affect. Her behavior is normal. Thought content normal.          Assessment & Plan:   Problem List Items Addressed This Visit      Cardiovascular and Mediastinum   Migraine - Primary     Without aura  Suspect rel to stopping OC /to see if she is menopausal  toradol 30 mg today IM  Phenergan 50 and px for phenergan oral  Fluids Enc caffeine only if she has a headache Update if no improvement or if recurrent or worse       Relevant Medications   SUMAtriptan (IMITREX) 20 MG/ACT nasal spray   ketorolac (TORADOL) 30 MG/ML injection 30 mg (Completed)   promethazine (PHENERGAN) injection 50 mg (Completed)

## 2015-05-30 NOTE — Progress Notes (Signed)
Pre visit review using our clinic review tool, if applicable. No additional management support is needed unless otherwise documented below in the visit note. 

## 2015-06-01 NOTE — Assessment & Plan Note (Signed)
Without aura  Suspect rel to stopping OC /to see if she is menopausal  toradol 30 mg today IM  Phenergan 50 and px for phenergan oral  Fluids Enc caffeine only if she has a headache Update if no improvement or if recurrent or worse

## 2015-06-11 ENCOUNTER — Telehealth: Payer: Self-pay | Admitting: *Deleted

## 2015-06-11 NOTE — Telephone Encounter (Signed)
"  Someone was supposed to have given me a call to schedule surgery."  I apologize, I was supposed to call you.  When would you like to schedule?  "I'd like to do it end of March, can we do it April 7th?"  That date is fine.  Surgical center will call you a day or two prior to surgery date with the arrival time.  I can't give you a time because they may have cancellations or patients that have medical issues that need to go first.  You will need to register with the surgical center, instructions are in the surgical center pamphlet.  "Will someone check my insurance and let me know how much is covered by my insurance?"  Jocelyn Lamer or Carloyn Jaeger will give you a call with that information.  Liane Comber bunionectomy [phone] and Exc. Nail Perm - [phone])

## 2015-06-11 NOTE — Telephone Encounter (Signed)
"  I just called to get an estimate for anesthesia.  I was told I need to get the time needed before they can give me an estimate.  I'm having an [employer]."  Procedure will probably take  30 - 45 minutes.  "Okay, that is all I needed to know.  Thank you."

## 2015-06-13 ENCOUNTER — Telehealth: Payer: Self-pay | Admitting: Family Medicine

## 2015-06-13 NOTE — Telephone Encounter (Signed)
PLEASE NOTE: All timestamps contained within this report are represented as [place] Standard Time. CONFIDENTIALTY NOTICE: This fax transmission is intended only for the addressee. It contains information that is legally privileged, confidential or otherwise protected from use or disclosure. If you are not the intended recipient, you are strictly prohibited from reviewing, disclosing, copying using or disseminating any of this information or taking any action in reliance on or regarding this information. If you have received this fax in error, please notify us immediately by telephone so that we can arrange for its return to [place]. Phone: [phone], Toll-Free: [phone], Fax: [phone] Page: 1 of 2 Call Id: [id number] [place] Patient Name: Meredith [name] Gender: Female DOB: 1960-12-13 Age: 54 Y 2 M 3 D Return Phone Number: [phone] (Primary) Address[place] [address] Client [place] [facility] - Client Client Site [place], [place] Contact Type Call Call Type Triage / Clinical Relationship To Patient Self Appointment Disposition EMR Appointment Not Necessary Info pasted into Epic Yes Return Phone Number [phone] (Primary) Chief Complaint Headache Initial Comment caller states she is having constant headaches and has no relief PreDisposition Home Care Nurse Assessment Nurse: [place] Desanctis, RN, Amy Date/Time Eilene Ghazi Time): 06/13/2015 4:24:16 PM Confirm and document reason for call. If symptomatic, describe symptoms. ---Caller states she has a headache. She was seen in doctor's office on Friday and given a shot. No better. Pain 10/10 on pain scale at night. Can't sleep. Neck pain also Has the patient traveled out of the country within the last 30 days? ---No Does the patient have any new or  worsening symptoms? ---Yes Will a triage be completed? ---Yes Related visit to physician within the last 2 weeks? ---Yes Does the PT have any chronic conditions? (i.e. diabetes, asthma, etc.) ---No Did the patient indicate they were pregnant? ---No Is this a behavioral health or substance abuse call? ---No Guidelines Guideline Title Affirmed Question Affirmed Notes Nurse Date/Time (Eastern Time) Headache Severe pain in one eye [place], RN, Amy 06/13/2015 4:27:15 PM Disp. Time Eilene Ghazi Time) Disposition Final User 06/13/2015 4:31:26 PM Go to ED Now Yes [place] Desanctis, RN, Amy Caller Understands: Yes Disagree/Comply: Comply PLEASE NOTE: All timestamps contained within this report are represented as [place] Standard Time. CONFIDENTIALTY NOTICE: This fax transmission is intended only for the addressee. It contains information that is legally privileged, confidential or otherwise protected from use or disclosure. If you are not the intended recipient, you are strictly prohibited from reviewing, disclosing, copying using or disseminating any of this information or taking any action in reliance on or regarding this information. If you have received this fax in error, please notify us immediately by telephone so that we can arrange for its return to [place]. Phone: [phone], Toll-Free: [phone], Fax: [phone] Page: 2 of 2 Call Id: [id number] Care Advice Given Per Guideline GO TO ED NOW: You need to be seen in the Emergency Department. Go to the ER at ___________ [name] now. Drive carefully. CARE ADVICE given per Headache (Adult) guideline. DRIVING: Another adult should drive. After Care Instructions Given Call Event Type User Date / Time Description Comments User: Laurence Ferrari, RN Date/Time Eilene Ghazi Time): 06/13/2015 4:32:38 PM Instructed patient to Go to ED now. She stated she was at work and needed to take care of something before she goes. Referrals [facility] -  ED

## 2015-06-13 NOTE — Telephone Encounter (Signed)
Dr Glori Bickers out of office;sending to Dr Glori Bickers as Juluis Rainier and Dr Damita Dunnings.

## 2015-06-13 NOTE — Telephone Encounter (Signed)
Noted, routed to PCP as FYI.   Per note was advised ER.

## 2015-06-13 NOTE — Telephone Encounter (Signed)
[place]    --------------------------------------------------------------------------------   Patient Name: Meredith [name]  DOB: [birth year]    Initial Comment caller states she is having constant headaches and has no relief       Nurse Assessment  Nurse: [place] Desanctis, RN, Amy Date/Time Eilene Ghazi Time): 06/13/2015 4:24:16 PM  Confirm and document reason for call. If symptomatic, describe symptoms. ---Caller states she has a headache. She was seen in doctor's office on Friday and given a shot. No better. Pain 10/10 on pain scale at night. Can't sleep. Neck pain also    Has the patient traveled out of the country within the last 30 days? ---No    Does the patient have any new or worsening symptoms? ---Yes    Will a triage be completed? ---Yes    Related visit to physician within the last 2 weeks? ---Yes    Does the PT have any chronic conditions? (i.e. diabetes, asthma, etc.) ---No    Did the patient indicate they were pregnant? ---No    Is this a behavioral health or substance abuse call? ---No           Guidelines      Guideline Title Affirmed Question Affirmed Notes  Headache Severe pain in one eye      Final Disposition User    Go to ED Now [place] Desanctis, RN, Amy      Comments  Instructed patient to Go to ED now. She stated she was at work and needed to take care of something before she goes.    Referrals  [facility] - ED    Disagree/Comply: Comply

## 2015-06-14 NOTE — Telephone Encounter (Signed)
I do not see any notes from ED- please check on her, thanks

## 2015-06-17 NOTE — Telephone Encounter (Signed)
Left message on voice mail  to call back

## 2015-06-26 NOTE — Telephone Encounter (Signed)
Left message on voicemail and letter mailed requesting pt to call office back

## 2015-08-12 ENCOUNTER — Other Ambulatory Visit: Payer: Self-pay | Admitting: Orthopedic Surgery

## 2015-08-12 DIAGNOSIS — M25511 Pain in right shoulder: Secondary | ICD-10-CM

## 2015-08-12 DIAGNOSIS — M25512 Pain in left shoulder: Secondary | ICD-10-CM

## 2015-08-22 ENCOUNTER — Ambulatory Visit
Admission: RE | Admit: 2015-08-22 | Discharge: 2015-08-22 | Disposition: A | Discharge status: Home / Self Care | Payer: PRIVATE HEALTH INSURANCE | Source: Ambulatory Visit | Attending: Orthopedic Surgery | Admitting: Orthopedic Surgery

## 2015-08-22 DIAGNOSIS — M25511 Pain in right shoulder: Secondary | ICD-10-CM

## 2015-08-22 DIAGNOSIS — M25512 Pain in left shoulder: Secondary | ICD-10-CM

## 2015-09-02 ENCOUNTER — Telehealth: Payer: Self-pay | Admitting: *Deleted

## 2015-09-02 NOTE — Telephone Encounter (Signed)
"  I have a foot surgery scheduled for 04/07.  I do need to cancel the surgery.  I found out yesterday that I have to have surgery on my shoulder.  I have to do that first.  Let me know if there's anywhere else I need to do to cancel.  I'll have to call back to reschedule that because I'm not sure when I will be able to do it."

## 2015-09-25 ENCOUNTER — Encounter: Payer: Self-pay | Admitting: Podiatry

## 2015-10-21 ENCOUNTER — Other Ambulatory Visit: Payer: Self-pay | Admitting: Orthopedic Surgery

## 2015-10-29 ENCOUNTER — Telehealth: Payer: Self-pay | Admitting: *Deleted

## 2015-10-29 NOTE — Telephone Encounter (Signed)
"  I'm calling to reschedule my surgery.  I had to cancel it before because I had to arrange my shoulder surgery.  I want to have it done 3 weeks after my shoulder surgery.  I want to go ahead and get everything taken care of.  Will it be okay to do it after 3 weeks?"  I'm not sure, that is something Dr. Milinda Pointer will need to advise you about.  You probably need to see him again to update your consent forms because it will be over 6 months that you signed consent forms.  "That is fine, I figured as much.  June 30 will be okay for the surgery."  I'll transfer you to a scheduler for an appointment with Dr. Milinda Pointer.    "Sorry to bother you again.  Will I be able to ride in a car the next day to go visit my 45 year old mother-in-law?"  You're not a bother.  It should be okay as long as you can prop your foot up while in the car.  "I have an appointment to see Dr. Milinda Pointer on 11/26/2015.  I'll make sure then."

## 2015-10-29 NOTE — Telephone Encounter (Signed)
"  This is [employer].  I'm calling for [employer].  She spoke to earlier about scheduling surgery for her foot.  She had scheduled earlier for June 30.  She needs to change that to July 7.  You can call me or call Vaughan Basta."  Your husband called and said you wanted to reschedule your surgery to July 7.  "Yes, I spoke to my husband and he said it's probably best to change it to July 2nd."  I'll change it to July 7.

## 2015-10-29 NOTE — Telephone Encounter (Signed)
Ok thanks 

## 2015-10-31 ENCOUNTER — Ambulatory Visit (INDEPENDENT_AMBULATORY_CARE_PROVIDER_SITE_OTHER): Payer: PRIVATE HEALTH INSURANCE | Admitting: Primary Care

## 2015-10-31 ENCOUNTER — Encounter: Payer: Self-pay | Admitting: Primary Care

## 2015-10-31 VITALS — BP 124/76 | HR 77 | Temp 97.9°F | Ht 66.25 in | Wt 182.1 lb

## 2015-10-31 DIAGNOSIS — H938X1 Other specified disorders of right ear: Secondary | ICD-10-CM | POA: Diagnosis not present

## 2015-10-31 NOTE — Patient Instructions (Signed)
Try Debrox drops to prevent wax buildup in your ears. Follow the package instructions.  There is no evidence of infection in your ears.  Please notify us if your symptoms persist.  It was a pleasure meeting you!

## 2015-10-31 NOTE — Progress Notes (Signed)
Pre visit review using our clinic review tool, if applicable. No additional management support is needed unless otherwise documented below in the visit note. 

## 2015-10-31 NOTE — Progress Notes (Signed)
Subjective:    Patient ID: Meredith [name], female    DOB: May 21, 1961, 55 y.o.   MRN: [id number]  HPI  Ms. Meredith [name] is a 55 year old female who presents today with a chief complaint of ear fullness. She also reports dizziness. The fullness is present to bilateral ears, but is worse to her right ear. Her symptoms have been present intermittently for the past 3 weeks. She has a history of allergic rhinitis. She's taken Claritin daily and Flonase for several days without significant improvement. Denies fevers, cough, chills, sore throat.  Review of Systems  Constitutional: Negative for fever.  HENT: Positive for rhinorrhea. Negative for congestion, sneezing and sore throat.        Ear fullness  Respiratory: Negative for cough and shortness of breath.   Cardiovascular: Negative for chest pain.  Musculoskeletal: Negative for myalgias.       Past Medical History  Diagnosis Date  . Allergic rhinitis   . GERD (gastroesophageal reflux disease)   . Esophagitis   . IBS (irritable bowel syndrome)   . HLD (hyperlipidemia)   . Obesity   . Dysmenorrhea   . Pure hypercholesterolemia   . Acquired keratoderma   . Pain in joint, lower leg   . History of migraine headaches   . Plantar fascial fibromatosis   . Acute sinusitis, unspecified   . Cancer [facility])     skin     Social History   Social History  . Marital Status: Married    Spouse Name: N/A  . Number of Children: N/A  . Years of Education: N/A   Occupational History  . Not on file.   Social History Main Topics  . Smoking status: Never Smoker   . Smokeless tobacco: Never Used  . Alcohol Use: 0.0 oz/week    0 Standard drinks or equivalent per week     Comment: Occasional  . Drug Use: No  . Sexual Activity: Yes    Birth Control/ Protection: Other-see comments     Comment: vasectomy   Other Topics Concern  . Not on file   Social History Narrative    Past Surgical History  Procedure Laterality Date  . Treadmill  stress test  01/13/11    Family History  Problem Relation Age of Onset  . Heart attack Mother   . Hypertension Mother   . Lung cancer Father     + smoker  . GER disease Father     esophageal stricture  . Hypertension Brother   . Other Sister 57    MAC disease    No Known Allergies  No current outpatient prescriptions on file prior to visit.   No current facility-administered medications on file prior to visit.    BP 124/76 mmHg  Pulse 77  Temp(Src) 97.9 F (36.6 C) (Oral)  Ht 5' 6.25" (1.683 m)  Wt 182 lb 1.9 oz (82.609 kg)  BMI 29.16 kg/m2  SpO2 99%    Objective:   Physical Exam  Constitutional: She appears well-nourished.  HENT:  Right Ear: Ear canal normal.  Left Ear: Tympanic membrane and ear canal normal.  Nose: No mucosal edema. Right sinus exhibits no maxillary sinus tenderness and no frontal sinus tenderness. Left sinus exhibits no maxillary sinus tenderness and no frontal sinus tenderness.  Mouth/Throat: Oropharynx is clear and moist.  Right middle canal with cerumen impaction, TM cannot be visualized. Right TM post irrigation with translucent TM's without evidence of infection.  Cardiovascular: Normal rate and regular rhythm.  Pulmonary/Chest: Effort normal and breath sounds normal.  Skin: Skin is warm and dry.          Assessment & Plan:  Ear Fullness:  Located bilaterally x 3 weeks, more so to right. Exam today with evidence of moderate cerumen impaction to right canal without visualization of TM. Left canal and TM unremarkable. Exam otherwise unremarkable. Does not appear ill. Right TM post irrigation with translucent TM's, no evidence of infection. Discussed the use of Debrox drops OTC for wax buildup prevention. Follow up PRN.

## 2015-11-12 ENCOUNTER — Telehealth: Payer: Self-pay | Admitting: Podiatry

## 2015-11-12 NOTE — Telephone Encounter (Signed)
Patient called, has a question about her bunion surgery scheduled for 12/19/15 with Dr, Milinda Pointer. She is scheduled to have shoulder surgery on 11/18/15 and then one month later her foot surgery. She wants someone to call her and let her know if she is going to have to use crutches with the shoulder surgery, or if she will even be able to put weight on her foot/ under arms after that shoulder surgery. Please call.

## 2015-11-12 NOTE — Telephone Encounter (Signed)
Will not need a crutch and no other considerations.

## 2015-11-12 NOTE — Telephone Encounter (Addendum)
Pt states she is having shoulder surgery 12/19/2015 and on 11/18/2015 foot surgery, will she have to use a crutch?  Pt asked will there be a problem with having the two surgeries aq month apart, since she will be put to sleep on the shoulder surgery.  I told pt she would not need crutch, would not be up or dangling the foot more than 15 mins/hour and and usually it was a quick put the foot down and quickly up and only if in the surgical boot. I told pt I would ask Dr. Milinda Pointer if there were other considerations with the surgeries within one month of each other and call again.  Informed pt of Dr. Stephenie Acres statement that no crutches and no special accommodations due to surgeries 1 month apart.

## 2015-11-13 ENCOUNTER — Encounter: Payer: Self-pay | Admitting: *Deleted

## 2015-11-13 ENCOUNTER — Other Ambulatory Visit: Payer: PRIVATE HEALTH INSURANCE

## 2015-11-13 NOTE — Patient Instructions (Signed)
  Your procedure is scheduled on: 11/20/15 Report to Day Surgery. [facility] second floor To find out your arrival time please call [phone] between 1PM - 3PM on 11/19/15  Remember: Instructions that are not followed completely may result in serious medical risk, up to and including death, or upon the discretion of your surgeon and anesthesiologist your surgery may need to be rescheduled.    _x___ 1. Do not eat food or drink liquids after midnight. No gum chewing or hard candies.     __x__ 2. No Alcohol for 24 hours before or after surgery.   ____ 3. Bring all medications with you on the day of surgery if instructed.    __x__ 4. Notify your doctor if there is any change in your medical condition     (cold, fever, infections).     Do not wear jewelry, make-up, hairpins, clips or nail polish.  Do not wear lotions, powders, or perfumes. You may wear deodorant.  Do not shave 48 hours prior to surgery. Men may shave face and neck.  Do not bring valuables to the hospital.    [facility] is not responsible for any belongings or valuables.               Contacts, dentures or bridgework may not be worn into surgery.  Leave your suitcase in the car. After surgery it may be brought to your room.  For patients admitted to the hospital, discharge time is determined by your                treatment team.   Patients discharged the day of surgery will not be allowed to drive home.   Please read over the following fact sheets that you were given:   Surgical Site Infection Prevention   ____ Take these medicines the morning of surgery with A SIP OF WATER:    1. none  2.   3.   4.  5.  6.  ____ Fleet Enema (as directed)   _x___ Use CHG Soap as directed  ____ Use inhalers on the day of surgery  ____ Stop metformin 2 days prior to surgery    ____ Take 1/2 of usual insulin dose the night before surgery and none on the morning of surgery.   ____ Stop Coumadin/Plavix/aspirin on   ____  Stop Anti-inflammatories on    ____ Stop supplements until after surgery.    ____ Bring C-Pap to the hospital.

## 2015-11-17 ENCOUNTER — Encounter
Admission: RE | Admit: 2015-11-17 | Discharge: 2015-11-17 | Disposition: A | Discharge status: Home / Self Care | Payer: PRIVATE HEALTH INSURANCE | Source: Ambulatory Visit | Attending: Orthopedic Surgery | Admitting: Orthopedic Surgery

## 2015-11-17 ENCOUNTER — Ambulatory Visit (INDEPENDENT_AMBULATORY_CARE_PROVIDER_SITE_OTHER): Payer: PRIVATE HEALTH INSURANCE | Admitting: Family Medicine

## 2015-11-17 ENCOUNTER — Encounter: Payer: Self-pay | Admitting: Family Medicine

## 2015-11-17 VITALS — BP 110/76 | HR 89 | Temp 98.4°F | Ht 66.25 in | Wt 184.5 lb

## 2015-11-17 DIAGNOSIS — Z01812 Encounter for preprocedural laboratory examination: Secondary | ICD-10-CM | POA: Diagnosis present

## 2015-11-17 DIAGNOSIS — J069 Acute upper respiratory infection, unspecified: Secondary | ICD-10-CM | POA: Insufficient documentation

## 2015-11-17 DIAGNOSIS — B9789 Other viral agents as the cause of diseases classified elsewhere: Principal | ICD-10-CM

## 2015-11-17 LAB — CBC WITH DIFFERENTIAL/PLATELET
BASOS ABS: 0 10*3/uL (ref 0–0.1)
Eosinophils Absolute: 0.2 10*3/uL (ref 0–0.7)
HEMATOCRIT: 34.9 % — AB (ref 35.0–47.0)
HEMOGLOBIN: 11.6 g/dL — AB (ref 12.0–16.0)
Lymphs Abs: 2.1 10*3/uL (ref 1.0–3.6)
MCH: 32.2 pg (ref 26.0–34.0)
MCHC: 33.2 g/dL (ref 32.0–36.0)
MCV: 96.9 fL (ref 80.0–100.0)
MONO ABS: 1.2 10*3/uL — AB (ref 0.2–0.9)
Monocytes Relative: 10 %
Neutro Abs: 8.6 10*3/uL — ABNORMAL HIGH (ref 1.4–6.5)
Neutrophils Relative %: 70 %
Platelets: 235 10*3/uL (ref 150–440)
RBC: 3.6 MIL/uL — ABNORMAL LOW (ref 3.80–5.20)
RDW: 13.1 % (ref 11.5–14.5)
WBC: 12.2 10*3/uL — AB (ref 3.6–11.0)

## 2015-11-17 LAB — BASIC METABOLIC PANEL
ANION GAP: 9 (ref 5–15)
BUN: 9 mg/dL (ref 6–20)
CALCIUM: 9 mg/dL (ref 8.9–10.3)
CO2: 26 mmol/L (ref 22–32)
Chloride: 103 mmol/L (ref 101–111)
Creatinine, Ser: 0.78 mg/dL (ref 0.44–1.00)
GFR calc Af Amer: >60 mL/min (ref >60)
GLUCOSE: 107 mg/dL — AB (ref 65–99)
POTASSIUM: 3.5 mmol/L (ref 3.5–5.1)
SODIUM: 138 mmol/L (ref 135–145)

## 2015-11-17 LAB — PROTIME-INR
INR: 1.16
Prothrombin Time: 15 seconds (ref 11.4–15.0)

## 2015-11-17 LAB — APTT: APTT: 34 s (ref 24–36)

## 2015-11-17 MED ORDER — GUAIFENESIN-CODEINE 100-10 MG/5ML PO SYRP: 5.0000 mL | ORAL_SOLUTION | Freq: Every evening | ORAL | Status: DC | PRN

## 2015-11-17 MED ORDER — BENZONATATE 200 MG PO CAPS: 200.0000 mg | ORAL_CAPSULE | Freq: Three times a day (TID) | ORAL | Status: DC | PRN

## 2015-11-17 NOTE — Patient Instructions (Signed)
You have a cold (head and chest)  Try tessalon for cough three times per day  Day- mucinex DM Night- guifenesin AC px  Drink lots of fluids Rest  Move your surgery date out   Update if not starting to improve in a week or if worsening

## 2015-11-17 NOTE — Assessment & Plan Note (Signed)
With reassuring exam You have a cold (head and chest)  Try tessalon for cough three times per day  Day- mucinex DM Night- guifenesin AC px  Drink lots of fluids Rest  Move your surgery date out   Update if not starting to improve in a week or if worsening

## 2015-11-17 NOTE — Progress Notes (Signed)
Subjective:    Patient ID: Meredith [name], female    DOB: 10/15/1960, 55 y.o.   MRN: [id number]  HPI Here with uri symptoms   Thinks it is a cold  Friday- throat became scratchy  Took mucinex all weekend - "for severe cough and cold" Nasal and chest congestion  Hoarse voice = throat is dry but not sore  Cough - was dry and now more productive -phlegm is hard to get it out  Color is white  No wheezing  No fever  No sinus pain - just pressure    She is supposed to have shoulder surgery on Thursday  Will have general anesthesia  May have to re schedule    Patient Active Problem List   Diagnosis Date Noted  . Viral URI with cough 11/17/2015  . Migraine 05/30/2015  . Cough 05/03/2014  . Impacted cerumen of right ear 02/06/2014  . ETD (eustachian tube dysfunction) 02/06/2014  . Vertigo 05/17/2013  . Perimenopause 01/05/2013  . Menorrhagia 01/05/2013  . Lumbar degenerative disc disease 03/08/2012  . SOB (shortness of breath) 01/21/2011  . Chest pain 01/12/2011  . Hypocalcemia 01/12/2011  . Leukocytosis 01/12/2011  . Other screening mammogram 01/12/2011  . Gynecological examination 01/12/2011  . Routine general medical examination at a health care facility 01/03/2011  . Sciatica 11/20/2010  . Neck pain 11/20/2010  . PLANTAR FASCIITIS, BILATERAL 08/28/2009  . Pain in joint, lower leg 06/17/2009  . KERATOSIS 05/25/2007  . HYPERCHOLESTEROLEMIA 10/13/2006  . ALLERGIC RHINITIS 10/13/2006  . GERD 10/13/2006  . IBS 10/13/2006  . DYSMENORRHEA 10/13/2006  . MIGRAINES, HX OF 10/13/2006   Past Medical History  Diagnosis Date  . Allergic rhinitis   . Esophagitis   . IBS (irritable bowel syndrome)   . HLD (hyperlipidemia)   . Obesity   . Dysmenorrhea   . Pure hypercholesterolemia   . Acquired keratoderma   . Pain in joint, lower leg   . History of migraine headaches   . Plantar fascial fibromatosis   . Acute sinusitis, unspecified   . Cancer ([place])     skin    . GERD (gastroesophageal reflux disease)     h/o  not much now   Past Surgical History  Procedure Laterality Date  . Treadmill stress test  01/13/11  . Colonoscopy     Social History  Substance Use Topics  . Smoking status: Never Smoker   . Smokeless tobacco: Never Used  . Alcohol Use: 0.0 oz/week    0 Standard drinks or equivalent per week     Comment: Occasional   Family History  Problem Relation Age of Onset  . Heart attack Mother   . Hypertension Mother   . Lung cancer Father     + smoker  . GER disease Father     esophageal stricture  . Hypertension Brother   . Other Sister 81    MAC disease   No Known Allergies No current outpatient prescriptions on file prior to visit.   No current facility-administered medications on file prior to visit.    Review of Systems  Constitutional: Positive for appetite change and fatigue. Negative for fever.  HENT: Positive for congestion, postnasal drip, rhinorrhea, sinus pressure, sneezing and sore throat. Negative for ear pain.   Eyes: Negative for pain and discharge.  Respiratory: Positive for cough. Negative for shortness of breath, wheezing and stridor.   Cardiovascular: Negative for chest pain.  Gastrointestinal: Negative for nausea, vomiting and diarrhea.  Genitourinary: Negative  for urgency, frequency and hematuria.  Musculoskeletal: Negative for myalgias and arthralgias.  Skin: Negative for rash.  Neurological: Positive for headaches. Negative for dizziness, weakness and light-headedness.  Psychiatric/Behavioral: Negative for confusion and dysphoric mood.       Objective:   Physical Exam  Constitutional: She appears well-developed and well-nourished. No distress.  Well appearing   HENT:  Head: Normocephalic and atraumatic.  Right Ear: External ear normal.  Left Ear: External ear normal.  Mouth/Throat: Oropharynx is clear and moist.  Nares are injected and congested  No sinus tenderness Clear rhinorrhea and post  nasal drip   Hoarse voice  Eyes: Conjunctivae and EOM are normal. Pupils are equal, round, and reactive to light. Right eye exhibits no discharge. Left eye exhibits no discharge.  Neck: Normal range of motion. Neck supple.  Cardiovascular: Normal rate and normal heart sounds.   Pulmonary/Chest: Effort normal and breath sounds normal. No respiratory distress. She has no wheezes. She has no rales. She exhibits no tenderness.  Harsh bs Good air exch   Lymphadenopathy:    She has no cervical adenopathy.  Neurological: She is alert.  Skin: Skin is warm and dry. No rash noted.  Psychiatric: She has a normal mood and affect.          Assessment & Plan:   Problem List Items Addressed This Visit      Respiratory   Viral URI with cough - Primary    With reassuring exam You have a cold (head and chest)  Try tessalon for cough three times per day  Day- mucinex DM Night- guifenesin AC px  Drink lots of fluids Rest  Move your surgery date out   Update if not starting to improve in a week or if worsening

## 2015-11-17 NOTE — Progress Notes (Signed)
Pre visit review using our clinic review tool, if applicable. No additional management support is needed unless otherwise documented below in the visit note. 

## 2015-11-24 ENCOUNTER — Encounter: Payer: PRIVATE HEALTH INSURANCE | Admitting: Family Medicine

## 2015-11-26 ENCOUNTER — Encounter: Payer: Self-pay | Admitting: Podiatry

## 2015-11-26 ENCOUNTER — Ambulatory Visit (INDEPENDENT_AMBULATORY_CARE_PROVIDER_SITE_OTHER): Payer: PRIVATE HEALTH INSURANCE | Admitting: Podiatry

## 2015-11-26 VITALS — BP 126/71 | HR 84 | Resp 16

## 2015-11-26 DIAGNOSIS — M79673 Pain in unspecified foot: Secondary | ICD-10-CM

## 2015-11-26 DIAGNOSIS — L603 Nail dystrophy: Secondary | ICD-10-CM

## 2015-11-26 DIAGNOSIS — M2010 Hallux valgus (acquired), unspecified foot: Secondary | ICD-10-CM

## 2015-11-26 NOTE — Patient Instructions (Signed)

## 2015-11-26 NOTE — Progress Notes (Signed)
She presents today for a follow-up of her consult hallux abductovalgus deformity and nail avulsion hallux left. It has been several months past the dead line so she has come in every sign her consent. She states nothing has changed really other than worsening of her bunion.  Objective: Vital signs are stable she is alert and oriented 3 she is pain on palpation first metatarsophalangeal joint of the left foot. The yellow dystrophic with mycotic nail hallux left. Pulses remain strong and palpable. No change in her past medical history medications or allergies. She has no calf pain.  Assessment: Hallux abductovalgus deformity left foot. Nail dystrophy hallux left.  Plan: Discussed etiology pathology conservative versus surgical therapies. At this point we performed a surgical consent form consisting of a Fransico Him Zwick osteotomy was a fixation and a surgical excision of toenail hallux left. I answered all of the questions regarding this procedure to the best of my ability in layman's terms. We discussed the possible postop complications which may include but are not limited to postop pain bleeding swelling infection recurrence need further surgery. She signed all 3 pages of the consent form. She understands and is amenable to it she has her cam walker with her at home and we be dispensed surgical instructions. Follow up with her in 3 weeks.

## 2015-11-27 ENCOUNTER — Encounter
Admission: RE | Disposition: A | Discharge status: Home / Self Care | Payer: Self-pay | Source: Ambulatory Visit | Attending: Orthopedic Surgery

## 2015-11-27 ENCOUNTER — Ambulatory Visit: Payer: PRIVATE HEALTH INSURANCE | Admitting: Certified Registered"

## 2015-11-27 ENCOUNTER — Ambulatory Visit
Admission: RE | Admit: 2015-11-27 | Discharge: 2015-11-27 | Disposition: A | Discharge status: Home / Self Care | Payer: PRIVATE HEALTH INSURANCE | Source: Ambulatory Visit | Attending: Orthopedic Surgery | Admitting: Orthopedic Surgery

## 2015-11-27 ENCOUNTER — Encounter: Payer: Self-pay | Admitting: *Deleted

## 2015-11-27 DIAGNOSIS — Z801 Family history of malignant neoplasm of trachea, bronchus and lung: Secondary | ICD-10-CM | POA: Insufficient documentation

## 2015-11-27 DIAGNOSIS — K219 Gastro-esophageal reflux disease without esophagitis: Secondary | ICD-10-CM | POA: Diagnosis not present

## 2015-11-27 DIAGNOSIS — M75121 Complete rotator cuff tear or rupture of right shoulder, not specified as traumatic: Secondary | ICD-10-CM | POA: Insufficient documentation

## 2015-11-27 DIAGNOSIS — M19011 Primary osteoarthritis, right shoulder: Secondary | ICD-10-CM | POA: Diagnosis present

## 2015-11-27 DIAGNOSIS — M7541 Impingement syndrome of right shoulder: Secondary | ICD-10-CM | POA: Insufficient documentation

## 2015-11-27 DIAGNOSIS — Y939 Activity, unspecified: Secondary | ICD-10-CM | POA: Insufficient documentation

## 2015-11-27 DIAGNOSIS — Z79899 Other long term (current) drug therapy: Secondary | ICD-10-CM | POA: Insufficient documentation

## 2015-11-27 DIAGNOSIS — X58XXXA Exposure to other specified factors, initial encounter: Secondary | ICD-10-CM | POA: Insufficient documentation

## 2015-11-27 HISTORY — PX: SHOULDER ARTHROSCOPY WITH OPEN ROTATOR CUFF REPAIR: SHX6092

## 2015-11-27 LAB — POCT PREGNANCY, URINE: Preg Test, Ur: NEGATIVE

## 2015-11-27 SURGERY — ARTHROSCOPY, SHOULDER WITH REPAIR, ROTATOR CUFF, OPEN
Anesthesia: General | Site: Shoulder | Laterality: Right | Wound class: Clean

## 2015-11-27 MED ORDER — FAMOTIDINE 20 MG PO TABS
20.0000 mg | ORAL_TABLET | Freq: Once | ORAL | Status: AC
  Administered 2015-11-27: 20 mg via ORAL

## 2015-11-27 MED ORDER — NEOMYCIN-POLYMYXIN B GU 40-200000 IR SOLN
Status: DC | PRN
  Administered 2015-11-27: 2 mL

## 2015-11-27 MED ORDER — CEFAZOLIN SODIUM-DEXTROSE 2-4 GM/100ML-% IV SOLN
INTRAVENOUS | Status: AC
  Filled 2015-11-27: qty 100

## 2015-11-27 MED ORDER — LIDOCAINE HCL (PF) 1 % IJ SOLN
INTRAMUSCULAR | Status: AC
  Filled 2015-11-27: qty 5

## 2015-11-27 MED ORDER — LIDOCAINE HCL (PF) 1 % IJ SOLN
INTRAMUSCULAR | Status: AC
  Filled 2015-11-27: qty 30

## 2015-11-27 MED ORDER — LACTATED RINGERS IV SOLN
INTRAVENOUS | Status: DC
  Administered 2015-11-27 (×2): via INTRAVENOUS

## 2015-11-27 MED ORDER — ONDANSETRON HCL 4 MG PO TABS: 4.0000 mg | ORAL_TABLET | Freq: Three times a day (TID) | ORAL | Status: DC | PRN

## 2015-11-27 MED ORDER — ROPIVACAINE HCL 5 MG/ML IJ SOLN
INTRAMUSCULAR | Status: AC
  Administered 2015-11-27: 30 mL via EPIDURAL
  Filled 2015-11-27: qty 40

## 2015-11-27 MED ORDER — BUPIVACAINE HCL (PF) 0.25 % IJ SOLN
INTRAMUSCULAR | Status: DC | PRN
  Administered 2015-11-27: 30 mL

## 2015-11-27 MED ORDER — SUCCINYLCHOLINE CHLORIDE 20 MG/ML IJ SOLN
INTRAMUSCULAR | Status: DC | PRN
  Administered 2015-11-27: 90 mg via INTRAVENOUS

## 2015-11-27 MED ORDER — SODIUM CHLORIDE 0.9 % IV SOLN
10000.0000 ug | INTRAVENOUS | Status: DC | PRN
  Administered 2015-11-27: 30 ug/min via INTRAVENOUS

## 2015-11-27 MED ORDER — OXYCODONE HCL 5 MG PO TABS
5.0000 mg | ORAL_TABLET | ORAL | Status: DC | PRN
Start: 2015-11-27 — End: 2019-03-21

## 2015-11-27 MED ORDER — NEOMYCIN-POLYMYXIN B GU 40-200000 IR SOLN
Status: AC
  Filled 2015-11-27: qty 1

## 2015-11-27 MED ORDER — FENTANYL CITRATE (PF) 100 MCG/2ML IJ SOLN
50.0000 ug | Freq: Once | INTRAMUSCULAR | Status: AC
  Administered 2015-11-27: 50 ug via INTRAVENOUS

## 2015-11-27 MED ORDER — FENTANYL CITRATE (PF) 100 MCG/2ML IJ SOLN: 25.0000 ug | INTRAMUSCULAR | Status: DC | PRN

## 2015-11-27 MED ORDER — FAMOTIDINE 20 MG PO TABS
ORAL_TABLET | ORAL | Status: AC
  Administered 2015-11-27: 20 mg
  Filled 2015-11-27: qty 1

## 2015-11-27 MED ORDER — LIDOCAINE HCL 1 % IJ SOLN
INTRAMUSCULAR | Status: DC | PRN
  Administered 2015-11-27: 10 mL

## 2015-11-27 MED ORDER — DEXAMETHASONE SODIUM PHOSPHATE 10 MG/ML IJ SOLN
INTRAMUSCULAR | Status: DC | PRN
  Administered 2015-11-27: 4 mg via INTRAVENOUS

## 2015-11-27 MED ORDER — BUPIVACAINE HCL (PF) 0.25 % IJ SOLN
INTRAMUSCULAR | Status: AC
Start: 2015-11-27 — End: 2015-11-27
  Filled 2015-11-27: qty 30

## 2015-11-27 MED ORDER — CEFAZOLIN SODIUM-DEXTROSE 2-4 GM/100ML-% IV SOLN
2.0000 g | INTRAVENOUS | Status: AC
  Administered 2015-11-27: 2 g via INTRAVENOUS

## 2015-11-27 MED ORDER — ONDANSETRON HCL 4 MG/2ML IJ SOLN
INTRAMUSCULAR | Status: DC | PRN
  Administered 2015-11-27: 4 mg via INTRAVENOUS

## 2015-11-27 MED ORDER — GLYCOPYRROLATE 0.2 MG/ML IJ SOLN
INTRAMUSCULAR | Status: DC | PRN
  Administered 2015-11-27: 0.4 mg via INTRAVENOUS

## 2015-11-27 MED ORDER — SODIUM CHLORIDE FLUSH 0.9 % IV SOLN
INTRAVENOUS | Status: AC
  Filled 2015-11-27: qty 3

## 2015-11-27 MED ORDER — EPINEPHRINE HCL 1 MG/ML IJ SOLN
INTRAMUSCULAR | Status: AC
  Filled 2015-11-27: qty 1

## 2015-11-27 MED ORDER — FENTANYL CITRATE (PF) 100 MCG/2ML IJ SOLN
INTRAMUSCULAR | Status: DC | PRN
  Administered 2015-11-27: 25 ug via INTRAVENOUS
  Administered 2015-11-27: 100 ug via INTRAVENOUS
  Administered 2015-11-27: 50 ug via INTRAVENOUS

## 2015-11-27 MED ORDER — NEOSTIGMINE METHYLSULFATE 10 MG/10ML IV SOLN
INTRAVENOUS | Status: DC | PRN
  Administered 2015-11-27: 3 mg via INTRAVENOUS

## 2015-11-27 MED ORDER — EPINEPHRINE HCL 1 MG/ML IJ SOLN
INTRAMUSCULAR | Status: DC | PRN
  Administered 2015-11-27: 13 mL

## 2015-11-27 MED ORDER — MIDAZOLAM HCL 5 MG/5ML IJ SOLN
INTRAMUSCULAR | Status: AC
  Administered 2015-11-27: 2 mg
  Filled 2015-11-27: qty 5

## 2015-11-27 MED ORDER — ROCURONIUM BROMIDE 100 MG/10ML IV SOLN
INTRAVENOUS | Status: DC | PRN
  Administered 2015-11-27: 5 mg via INTRAVENOUS
  Administered 2015-11-27: 40 mg via INTRAVENOUS
  Administered 2015-11-27: 5 mg via INTRAVENOUS
  Administered 2015-11-27: 10 mg via INTRAVENOUS
  Administered 2015-11-27 (×2): 5 mg via INTRAVENOUS

## 2015-11-27 MED ORDER — CHLORHEXIDINE GLUCONATE 4 % EX LIQD: 1.0000 "application " | Freq: Once | CUTANEOUS | Status: DC

## 2015-11-27 MED ORDER — ONDANSETRON HCL 4 MG/2ML IJ SOLN: 4.0000 mg | Freq: Once | INTRAMUSCULAR | Status: DC | PRN

## 2015-11-27 MED ORDER — PHENYLEPHRINE HCL 10 MG/ML IJ SOLN
INTRAMUSCULAR | Status: DC | PRN
  Administered 2015-11-27: 100 ug via INTRAVENOUS
  Administered 2015-11-27 (×2): 200 ug via INTRAVENOUS
  Administered 2015-11-27: 100 ug via INTRAVENOUS
  Administered 2015-11-27 (×2): 200 ug via INTRAVENOUS

## 2015-11-27 MED ORDER — LIDOCAINE HCL (CARDIAC) 20 MG/ML IV SOLN
INTRAVENOUS | Status: DC | PRN
  Administered 2015-11-27: 80 mg via INTRAVENOUS

## 2015-11-27 MED ORDER — PROPOFOL 10 MG/ML IV BOLUS
INTRAVENOUS | Status: DC | PRN
  Administered 2015-11-27: 160 mg via INTRAVENOUS

## 2015-11-27 MED ORDER — FENTANYL CITRATE (PF) 100 MCG/2ML IJ SOLN
INTRAMUSCULAR | Status: AC
  Administered 2015-11-27: 50 ug via INTRAVENOUS
  Filled 2015-11-27: qty 2

## 2015-11-27 SURGICAL SUPPLY — 72 items
ADAPTER IRRIG TUBE 2 SPIKE SOL (ADAPTER) ×6 IMPLANT
ANCH SUT Q-FX 2.8 (Anchor) ×1 IMPLANT
ANCHOR ALL-SUT Q-FIX 2.8 (Anchor) ×3 IMPLANT
BUR RADIUS 4.0X18.5 (BURR) ×3 IMPLANT
BUR RADIUS 5.5 (BURR) ×3 IMPLANT
CANNULA 5.75X7 CRYSTAL CLEAR (CANNULA) ×3 IMPLANT
CANNULA PARTIAL THREAD 2X7 (CANNULA) ×3 IMPLANT
CANNULA TWIST IN 8.25X9CM (CANNULA) ×6 IMPLANT
CLOSURE WOUND 1/2 X4 (GAUZE/BANDAGES/DRESSINGS) ×1
CONNECTOR PERFECT PASSER (CONNECTOR) ×3 IMPLANT
COOLER POLAR GLACIER W/PUMP (MISCELLANEOUS) ×3 IMPLANT
CRADLE LAMINECT ARM (MISCELLANEOUS) ×3 IMPLANT
DRAPE IMP U-DRAPE 54X76 (DRAPES) ×6 IMPLANT
DRAPE INCISE IOBAN 66X45 STRL (DRAPES) ×3 IMPLANT
DRAPE SHEET LG 3/4 BI-LAMINATE (DRAPES) ×3 IMPLANT
DRAPE U-SHAPE 47X51 STRL (DRAPES) ×3 IMPLANT
DURAPREP 26ML APPLICATOR (WOUND CARE) ×9 IMPLANT
ELECT REM PT RETURN 9FT ADLT (ELECTROSURGICAL) ×3
ELECTRODE REM PT RTRN 9FT ADLT (ELECTROSURGICAL) ×1 IMPLANT
GAUZE PETRO XEROFOAM 1X8 (MISCELLANEOUS) ×3 IMPLANT
GAUZE SPONGE 4X4 12PLY STRL (GAUZE/BANDAGES/DRESSINGS) ×6 IMPLANT
GLOVE BIOGEL PI IND STRL 9 (GLOVE) ×1 IMPLANT
GLOVE BIOGEL PI INDICATOR 9 (GLOVE) ×2
GLOVE SURG 9.0 ORTHO LTXF (GLOVE) ×6 IMPLANT
GOWN STRL REUS TWL 2XL XL LVL4 (GOWN DISPOSABLE) ×3 IMPLANT
GOWN STRL REUS W/ TWL LRG LVL3 (GOWN DISPOSABLE) ×1 IMPLANT
GOWN STRL REUS W/ TWL LRG LVL4 (GOWN DISPOSABLE) ×1 IMPLANT
GOWN STRL REUS W/TWL LRG LVL3 (GOWN DISPOSABLE) ×2
GOWN STRL REUS W/TWL LRG LVL4 (GOWN DISPOSABLE) ×3
IV LACTATED RINGER IRRG 3000ML (IV SOLUTION) ×26
IV LR IRRIG 3000ML ARTHROMATIC (IV SOLUTION) ×13 IMPLANT
KIT RM TURNOVER STRD PROC AR (KITS) ×3 IMPLANT
KIT STABILIZATION SHOULDER (MISCELLANEOUS) ×3 IMPLANT
KIT SUTURE 2.8 Q-FIX DISP (MISCELLANEOUS) ×3 IMPLANT
KIT SUTURETAK 3.0 INSERT PERC (KITS) IMPLANT
MANIFOLD NEPTUNE II (INSTRUMENTS) ×3 IMPLANT
MASK FACE SPIDER DISP (MASK) ×3 IMPLANT
MAT BLUE FLOOR 46X72 FLO (MISCELLANEOUS) ×6 IMPLANT
NDL SAFETY 18GX1.5 (NEEDLE) ×3 IMPLANT
NDL SAFETY 22GX1.5 (NEEDLE) ×3 IMPLANT
NEEDLE HYPO 18GX1.5 BLUNT FILL (NEEDLE) ×6 IMPLANT
NEEDLE HYPO 22GX1.5 SAFETY (NEEDLE) ×3 IMPLANT
NS IRRIG 500ML POUR BTL (IV SOLUTION) ×3 IMPLANT
PACK ARTHROSCOPY SHOULDER (MISCELLANEOUS) ×3 IMPLANT
PAD WRAPON POLAR SHDR UNIV (MISCELLANEOUS) ×1 IMPLANT
PASSER SUT CAPTURE FIRST (SUTURE) ×3 IMPLANT
SET TUBE SUCT SHAVER OUTFL 24K (TUBING) ×3 IMPLANT
SET TUBE TIP INTRA-ARTICULAR (MISCELLANEOUS) ×3 IMPLANT
STRAP SAFETY BODY (MISCELLANEOUS) ×3 IMPLANT
STRIP CLOSURE SKIN 1/2X4 (GAUZE/BANDAGES/DRESSINGS) ×2 IMPLANT
SUT ETHIBOND GREEN BRAID 0S 4 (SUTURE) ×3 IMPLANT
SUT ETHILON 4-0 (SUTURE) ×4
SUT ETHILON 4-0 FS2 18XMFL BLK (SUTURE) ×2
SUT KNTLS 2.8 MAGNUM (Anchor) ×9 IMPLANT
SUT LASSO 90 DEG SD STR (SUTURE) IMPLANT
SUT MNCRL 4-0 (SUTURE) ×3
SUT MNCRL 4-0 27XMFL (SUTURE) ×1
SUT PDS AB 0 CT1 27 (SUTURE) IMPLANT
SUT PERFECTPASSER WHITE CART (SUTURE) ×9 IMPLANT
SUT SMART STITCH CARTRIDGE (SUTURE) ×15 IMPLANT
SUT VIC AB 0 CT1 36 (SUTURE) ×3 IMPLANT
SUT VIC AB 2-0 CT2 27 (SUTURE) ×3 IMPLANT
SUTURE ETHLN 4-0 FS2 18XMF BLK (SUTURE) ×2 IMPLANT
SUTURE MAGNUM WIRE 2X48 BLK (SUTURE) ×6 IMPLANT
SUTURE MNCRL 4-0 27XMF (SUTURE) ×1 IMPLANT
SYRINGE 10CC LL (SYRINGE) ×9 IMPLANT
TAPE MICROFOAM 4IN (TAPE) ×3 IMPLANT
TUBING ARTHRO INFLOW-ONLY STRL (TUBING) ×3 IMPLANT
TUBING CONNECTING 10 (TUBING) ×2 IMPLANT
TUBING CONNECTING 10' (TUBING) ×1
WAND HAND CNTRL MULTIVAC 90 (MISCELLANEOUS) ×3 IMPLANT
WRAPON POLAR PAD SHDR UNIV (MISCELLANEOUS) ×3

## 2015-11-27 NOTE — Anesthesia Procedure Notes (Addendum)
Anesthesia Regional Block:  Interscalene brachial plexus block  Pre-Anesthetic Checklist: ,, timeout performed, Correct Patient, Correct Site, Correct Laterality, Correct Procedure, Correct Position, site marked, Risks and benefits discussed,  Surgical consent,  Pre-op evaluation,  At surgeon's request and post-op pain management  Laterality: Right  Prep: chloraprep       Needles:  Injection technique: Single-shot  Needle Type: Echogenic Stimulator Needle     Needle Length: 10cm 10 cm Needle Gauge: 22 and 22 G    Additional Needles:  Procedures: ultrasound guided (picture in chart) and nerve stimulator Interscalene brachial plexus block  Nerve Stimulator or Paresthesia:  Response: biceps, 0.8 mA,   Additional Responses:   Narrative:  Start time: 11/27/2015 7:28 AM End time: 11/27/2015 7:47 AM Injection made incrementally with aspirations every 5 mL.  Performed by: Personally  Anesthesiologist: Gunnar Fusi   Procedure Name: Intubation Performed by: Lance Muss Pre-anesthesia Checklist: Emergency Drugs available, Patient identified, Suction available, Patient being monitored and Timeout performed Patient Re-evaluated:Patient Re-evaluated prior to inductionOxygen Delivery Method: Circle system utilized Preoxygenation: Pre-oxygenation with 100% oxygen Intubation Type: IV induction Ventilation: Mask ventilation without difficulty Laryngoscope Size: Mac and 3 Grade View: Grade III Tube type: Oral Tube size: 7.0 mm Number of attempts: 1 Airway Equipment and Method: Stylet and Bougie stylet Placement Confirmation: ETT inserted through vocal cords under direct vision,  positive ETCO2 and breath sounds checked- equal and bilateral Secured at: 22 cm Tube secured with: Tape Dental Injury: Teeth and Oropharynx as per pre-operative assessment  Difficulty Due To: Difficult Airway- due to anterior larynx and Difficult Airway- due to limited oral opening Comments: Recommend  Mcgrath.or glidescope

## 2015-11-27 NOTE — H&P (Signed)
The patient has been re-examined, and the chart reviewed, and there have been no interval changes to the documented history and physical.    The risks, benefits, and alternatives have been discussed at length, and the patient is willing to proceed.   

## 2015-11-27 NOTE — Discharge Instructions (Signed)
AMBULATORY SURGERY  °DISCHARGE INSTRUCTIONS ° ° °1) The drugs that you were given will stay in your system until tomorrow so for the next 24 hours you should not: ° °A) Drive an automobile °B) Make any legal decisions °C) Drink any alcoholic beverage ° ° °2) You may resume regular meals tomorrow.  Today it is better to start with liquids and gradually work up to solid foods. ° °You may eat anything you prefer, but it is better to start with liquids, then soup and crackers, and gradually work up to solid foods. ° ° °3) Please notify your doctor immediately if you have any unusual bleeding, trouble breathing, redness and pain at the surgery site, drainage, fever, or pain not relieved by medication. ° ° ° °4) Additional Instructions: ° ° ° ° ° ° ° °Please contact your physician with any problems or Same Day Surgery at [phone], Monday through Friday 6 am to 4 pm, or [place] [facility] [place] Main number at [phone]. °

## 2015-11-27 NOTE — Anesthesia Preprocedure Evaluation (Signed)
Anesthesia Evaluation  Patient identified by MRN, date of birth, ID band Patient awake    Reviewed: Allergy & Precautions, NPO status , Patient's Chart, lab work & pertinent test results  History of Anesthesia Complications Negative for: history of anesthetic complications  Airway Mallampati: II       Dental   Pulmonary neg pulmonary ROS,           Cardiovascular negative cardio ROS       Neuro/Psych negative neurological ROS     GI/Hepatic Neg liver ROS, GERD  Poorly Controlled,  Endo/Other  negative endocrine ROS  Renal/GU negative Renal ROS     Musculoskeletal   Abdominal   Peds  Hematology negative hematology ROS (+)   Anesthesia Other Findings   Reproductive/Obstetrics                             Anesthesia Physical Anesthesia Plan  ASA: II  Anesthesia Plan: General   Post-op Pain Management:    Induction: Intravenous  Airway Management Planned: Oral ETT  Additional Equipment:   Intra-op Plan:   Post-operative Plan:   Informed Consent: I have reviewed the patients History and Physical, chart, labs and discussed the procedure including the risks, benefits and alternatives for the proposed anesthesia with the patient or authorized representative who has indicated his/her understanding and acceptance.     Plan Discussed with:   Anesthesia Plan Comments:         Anesthesia Quick Evaluation

## 2015-11-27 NOTE — Progress Notes (Signed)
Notified PACU RN Dr. Mack Guise needs to do history and physical.  And [facility].

## 2015-11-27 NOTE — Op Note (Addendum)
11/27/2015  11:08 AM  PATIENT:  Meredith [name]  55 y.o. 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    The risks benefits and alternatives were discussed with the patient preoperatively including but not limited to the risks of infection, bleeding, nerve injury, persistent pain or weakness, failure of the hardware, re-tear of the rotator cuff and the need for further surgery. Medical risks include DVT and pulmonary embolism, myocardial infarction, stroke, pneumonia, respiratory failure and death. Patient understood these risks and wished to proceed.  OPERATIVE IMPLANTS: ArthroCare Magnum 2 anchors, Smith and nephew Q fix anchor x 1  OPERATIVE FINDINGS: Full thickness tear involving the supraspinatus with retraction.  High-grade partial-thickness tear involving the intra-articular tendon of the biceps tendon  OPERATIVE PROCEDURE: The patient was met in the preoperative area. The right shoulder was signed with the word yes and my initials according the hospital's correct site of surgery protocol. The patient underwent placement of a right interscalene block by the anesthesia service in the preop area..  Patient was  brought to the operating room where underwent general endotracheal intubation following her interscalene block . The patient was placed in a beachchair position. A spider arm positioner was used for this case. Examination under anesthesia revealed no limitation of motion or instability with load shift testing. The patient had a negative sulcus sign.  The patient was prepped and draped in a sterile fashion. A timeout was performed to verify the patient's name, date of birth, medical record number, correct site of surgery and correct procedure to be performed there was also used to verify the patient received antibiotics that all appropriate instruments, implants and radiographs studies were available in the room. Once all in attendance were in agreement case began.  Bony landmarks were drawn out with a surgical marker along with proposed arthroscopy incisions. These were pre-injected with 1% lidocaine plain. An 11 blade was used to establish a posterior portal through which the arthroscope was placed in the glenohumeral joint. A full diagnostic examination of the shoulder was performed. the anterior portal was established under direct visualization with an 18-gauge spinal needle. A 5.75 mm arthroscopic cannula was placed through this anterior portal.   The intra-articular portion of the biceps tendon was found to have advanced intratendinous degeneration with significant thickening of the tendon, therefore the decision was made to perform a tenodesis. An Arthocare Perfect Pass suture placed in the biceps tendon and biceps tendon was released from its attachment to appear labrum.  The sutures were clamped with a hemostat for later biceps tenodesis.  The arthroscope was then placed in the subacromial space. A lateral portal was then established using an 18-gauge spinal needle for localization.The supraspinatus tear was easily identified. There was a longitudinal component to the tear. 2 perfect sutures were  placed arthroscopically to rule out a side-to-side repair. A single Arthrocare  Perfect Pass suture was then placed in the lateral border of the rotator cuff tear. The greater tuberosity was debrided using a 4.0 mm resector shaver blade to remove all remaining foreign fibers of the rotator cuff. Debridement was performed until punctate bleeding was seen at the greater tuberosity footprint, which will allow for rotator cuff healing..   The subacromial bursa was debrided using a 4-0 resector shaver blade and a 90 ArthroCare wand from the lateral portal. A subacromial decompression was also performed using a 5.5 mm resector shaver blade from the lateral portal. Patient had a distal clavicle excision also performed with a 5.5 mm resector shaver blade. Final arthroscopic images were taken of the subacromial space. All arthroscopic instruments were then removed and the mini-open portion of the procedure began.   A saber-type incision was made along the lateral border of the acromion. The deltoid muscle was identified and split in line with its fibers which allowed visualization of the rotator cuff. The biceps tendon and its associated tagging suture were brought out through the deltoid split. The Perfect Pass suture previously placed in the lateral border of the rotator cuff was also brought out through the deltoid split.   A second perfect pass suture was placed in the biceps tendon and approximately 102m of the intra-articular biceps was resected using a #15 blade. A tenodesis was performed placing the biceps tendon at the top of the bicipital groove with a single Magnum 2 anchor.   A second perfect pass suture was then placed through the lateral border of the supraspinatus tear. The lateral edge of the rotator cuff was debrided until healthy, viable tissue remained. A Q fix anchor was then placed at the articular margin of the humeral head. The 4 limbs of this anchor were then passed medially  through the rotator cuff with a perfect pass suture passer. These were clamped with a hemostat for later medial row fixation.  The two perfect pass sutures from the lateral border of the rotator cuff were then anchored to thegreater tuberosity of the humeral head using two Magnum 2 anchors. These anchors were tensioned to allow for anatomic reduction of the rotator cuff to the greater tuberosity footprint. The medial row repair was then completed using an arthroscopic knot tying technique with the Q fix anchor sutures. Finally 2 additional side-to-side sutures were placed with a #1 Ethibond and hand.   Once all sutures were tied down, arthroscopic images of thedouble row repair were taken with the arthroscope both externally and arthroscopically fromthe glenohumeral joint.  All incisions were copiously irrigated. The deltoid fascia was repaired using a 0 Vicryl suture in an interrupted fashion.  The subcutaneous tissue of all incisions were closed with a 2-0 Vicryl. Skin closure for the arthroscopic incisions was performed with 4-0 nylon. The skin edges of the saber incision were approximated with a running 4-0 undyed Monocryl. A dry sterile dressing was applied. The patient was placed in an abduction sling, with a Polar Care sleeve.  All sharp and instrument counts were correct at the conclusion of the case. I was scrubbed and present for the entire case. I spoke with the patient's husband postoperatively to let him know the case had gone without complication and the patient was stable in recovery room.

## 2015-11-27 NOTE — Transfer of Care (Signed)
Immediate Anesthesia Transfer of Care Note  Patient: Meredith [name]  Procedure(s) Performed: Procedure(s): SHOULDER ARTHROSCOPY WITH OPEN ROTATOR CUFF REPAIR (Right)  Patient Location: PACU  Anesthesia Type:General  Level of Consciousness: awake and responds to stimulation  Airway & Oxygen Therapy: Patient Spontanous Breathing and Patient connected to face mask oxygen  Post-op Assessment: Report given to RN and Post -op Vital signs reviewed and stable  Post vital signs: Reviewed and stable  Last Vitals:  Filed Vitals:   11/27/15 0745 11/27/15 1101  BP: 119/66 132/72  Pulse: 79 80  Temp:    Resp: 22 19    Last Pain: There were no vitals filed for this visit.       Complications: No apparent anesthesia complications

## 2015-11-27 NOTE — Anesthesia Postprocedure Evaluation (Signed)
Anesthesia Post Note  Patient: Meredith [name]  Procedure(s) Performed: Procedure(s) (LRB): SHOULDER ARTHROSCOPY WITH OPEN ROTATOR CUFF REPAIR (Right)  Patient location during evaluation: PACU Anesthesia Type: General Level of consciousness: awake and alert Pain management: pain level controlled Vital Signs Assessment: post-procedure vital signs reviewed and stable Respiratory status: spontaneous breathing and respiratory function stable Cardiovascular status: stable Anesthetic complications: no    Last Vitals:  Filed Vitals:   11/27/15 1100 11/27/15 1101  BP:  132/72  Pulse: 83 80  Temp:    Resp: 20 19    Last Pain:  Filed Vitals:   11/27/15 1105  PainSc: 0-No pain                 Aime Meloche K

## 2015-12-02 ENCOUNTER — Ambulatory Visit: Payer: Self-pay | Admitting: Family Medicine

## 2015-12-05 ENCOUNTER — Telehealth: Payer: Self-pay | Admitting: *Deleted

## 2015-12-05 NOTE — Telephone Encounter (Signed)
Per Levada Dy, patient wants to cancel surgery.  Just had shoulder surgery and was told they have to go in again.  Declined to reschedule, will call when she ready to reschedule.    I called and left Caren Griffins a message to cancel patient's surgery scheduled for 12/19/2015.

## 2015-12-25 ENCOUNTER — Encounter: Payer: Self-pay | Admitting: Podiatry

## 2016-04-08 ENCOUNTER — Other Ambulatory Visit: Payer: PRIVATE HEALTH INSURANCE

## 2016-04-15 ENCOUNTER — Ambulatory Visit: Admit: 2016-04-15 | Payer: PRIVATE HEALTH INSURANCE | Admitting: Orthopedic Surgery

## 2016-04-15 SURGERY — ARTHROSCOPY, SHOULDER WITH REPAIR, ROTATOR CUFF, OPEN
Anesthesia: Choice | Laterality: Left

## 2017-05-04 ENCOUNTER — Telehealth: Payer: Self-pay | Admitting: Family Medicine

## 2017-05-04 NOTE — Telephone Encounter (Signed)
Pt c/o cough since October. Stated green secretions. Requesting antibiotics. Appt made with R. Baity for next week per patient request.

## 2017-05-04 NOTE — Telephone Encounter (Signed)
Recommend Mucinex and Delsym until can be seen. [facility] or [facility] if unable to wait until appt.

## 2017-05-04 NOTE — Telephone Encounter (Signed)
Pt is aware as instructed and stated she is going out of town so she with try OTC and keep upcoming appt

## 2017-05-12 ENCOUNTER — Ambulatory Visit: Payer: PRIVATE HEALTH INSURANCE | Admitting: Internal Medicine

## 2018-06-26 ENCOUNTER — Ambulatory Visit: Payer: PRIVATE HEALTH INSURANCE | Admitting: Podiatry

## 2018-09-21 ENCOUNTER — Telehealth: Payer: Self-pay

## 2018-09-21 NOTE — Telephone Encounter (Signed)
She can try mucinex dm or robitussin dm for cough  Cannot adv further w/o a visit  Urgent care is also an option   If sob -go to ER

## 2018-09-21 NOTE — Telephone Encounter (Signed)
Pt said she has non prod cough worse at night because pt is not resting at night. Temp today 98 something. H/A but pt said that is due to lack of sleep. No SOB or wheezing. Pt feels tight in upper center of chest that is worse when pt coughs. No travel and no known exposure to covid or flu. Offered pt a virtual appt and pt said to send note to Dr Glori Bickers pt did not want to make an appt. [facility], building services. Pt last seen 11/17/15.

## 2018-09-21 NOTE — Telephone Encounter (Signed)
Pt notified of Dr. Marliss Coots comments and verbalized understanding. She will try the OTC meds 1st and if that doesn't help she will schedule a virtual visit

## 2019-03-05 ENCOUNTER — Telehealth: Payer: Self-pay | Admitting: *Deleted

## 2019-03-05 NOTE — Telephone Encounter (Signed)
"  I have an appointment to see Dr. Milinda Pointer on October 7.  My question is I was starting to schedule bunion surgery a few years ago.  I had to cancel but now I want to get it done.  So I'm going to see him again to get it set up but I'm wondering if I can go ahead and put some time on your calendar for November.  I really need to have this done this year."

## 2019-03-07 NOTE — Telephone Encounter (Signed)
I'm returning your call.  I can schedule you tentatively but that does not guarantee a spot.  He can do your surgery on November 6, 13 20.  "Let's schedule it tentatively for November 13.  Do you know how long I will be out of work?  I am working from home and I sit at a desk all day."  You can go back to work after a week if you can sit and elevate.  "I'd like to stay out longer than that, maybe two to three weeks."  That's fine the normal range is six to eight weeks.  "So, I'm scheduled tentatively for November 13 and that will be finalized when I come in?"  Yes, that is correct, your surgery will be finalized during that visit.

## 2019-03-21 ENCOUNTER — Other Ambulatory Visit: Payer: Self-pay

## 2019-03-21 ENCOUNTER — Ambulatory Visit (INDEPENDENT_AMBULATORY_CARE_PROVIDER_SITE_OTHER): Payer: BLUE CROSS/BLUE SHIELD

## 2019-03-21 ENCOUNTER — Encounter: Payer: Self-pay | Admitting: Podiatry

## 2019-03-21 ENCOUNTER — Ambulatory Visit (INDEPENDENT_AMBULATORY_CARE_PROVIDER_SITE_OTHER): Payer: BLUE CROSS/BLUE SHIELD | Admitting: Podiatry

## 2019-03-21 DIAGNOSIS — M2012 Hallux valgus (acquired), left foot: Secondary | ICD-10-CM | POA: Diagnosis not present

## 2019-03-21 NOTE — Patient Instructions (Signed)
Pre-Operative Instructions  Congratulations, you have decided to take an important step towards improving your quality of life.  You can be assured that the doctors and staff at [facility] will be with you every step of the way.  Here are some important things you should know:  1. Plan to be at the surgery center/hospital at least 1 (one) hour prior to your scheduled time, unless otherwise directed by the surgical center/hospital staff.  You must have a responsible adult accompany you, remain during the surgery and drive you home.  Make sure you have directions to the surgical center/hospital to ensure you arrive on time. 2. If you are having surgery at [facility] or [facility], you will need a copy of your medical history and physical form from your family physician within one month prior to the date of surgery. We will give you a form for your primary physician to complete.  3. We make every effort to accommodate the date you request for surgery.  However, there are times where surgery dates or times have to be moved.  We will contact you as soon as possible if a change in schedule is required.   4. No aspirin/ibuprofen for one week before surgery.  If you are on aspirin, any non-steroidal anti-inflammatory medications (Mobic, Aleve, Ibuprofen) should not be taken seven (7) days prior to your surgery.  You make take Tylenol for pain prior to surgery.  5. Medications - If you are taking daily heart and blood pressure medications, seizure, reflux, allergy, asthma, anxiety, pain or diabetes medications, make sure you notify the surgery center/hospital before the day of surgery so they can tell you which medications you should take or avoid the day of surgery. 6. No food or drink after midnight the night before surgery unless directed otherwise by surgical center/hospital staff. 7. No alcoholic beverages 24-hours prior to surgery.  No smoking 24-hours prior or 24-hours after  surgery. 8. Wear loose pants or shorts. They should be loose enough to fit over bandages, boots, and casts. 9. Don't wear slip-on shoes. Sneakers are preferred. 10. Bring your boot with you to the surgery center/hospital.  Also bring crutches or a walker if your physician has prescribed it for you.  If you do not have this equipment, it will be provided for you after surgery. 11. If you have not been contacted by the surgery center/hospital by the day before your surgery, call to confirm the date and time of your surgery. 12. Leave-time from work may vary depending on the type of surgery you have.  Appropriate arrangements should be made prior to surgery with your employer. 13. Prescriptions will be provided immediately following surgery by your doctor.  Fill these as soon as possible after surgery and take the medication as directed. Pain medications will not be refilled on weekends and must be approved by the doctor. 14. Remove nail polish on the operative foot and avoid getting pedicures prior to surgery. 15. Wash the night before surgery.  The night before surgery wash the foot and leg well with water and the antibacterial soap provided. Be sure to pay special attention to beneath the toenails and in between the toes.  Wash for at least three (3) minutes. Rinse thoroughly with water and dry well with a towel.  Perform this wash unless told not to do so by your physician.  Enclosed: 1 Ice pack (please put in freezer the night before surgery)   1 Hibiclens skin cleaner     Pre-op instructions  If you have any questions regarding the instructions, please do not hesitate to call our office.  [place]: [address], , [place] [address] -- [phone]  [place]: [address] [place], [place] [address] -- [phone]  [place]: [address]  [place], [place] [address] -- [phone]   Website: [email or web address] 

## 2019-03-21 NOTE — Progress Notes (Signed)
She presents today with chief complaint of a painful bunion left foot states is been present for many years and she had even seen this previously for surgical consult that she had to delay because of surgery on her right arm.  States that is starting to affect her ability to perform her daily activities.  She denies any past medical history medications allergies surgeries or social history.  Objective: Vital signs are stable alert and oriented x3.  Pulses are palpable.  There is no erythema edema cellulitis drainage or odor she has severe hallux abductovalgus deformity with a flexible first metatarsal phalangeal joint of the left foot.  She has severe thickened toenail hallux left is exquisitely tender.  I also reviewed her radiographic evaluation taken today demonstrate any increase in the first intermetatarsal angle and hallux abductus angle greater than normal value.  She has hallux abductus greater than normal value with early dislocation.  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  Assessment: Painful hallux abductovalgus deformity with a severe increase in the first intermetatarsal angle.  She also has a dystrophic nail which is painful.  Plan: Discussed etiology pathology conservative versus surgical therapies at this point we consented her for a Lapidus procedure with fusion of the first metatarsal medial cuneiform joint with excision of the nail  We discussed possible side effects.  We discussed possible side effects which may include but not limited to postop pain bleeding swelling infection recurrence need for further surgery overcorrection under correction loss of digit loss of limb loss of life.  We also discussed the fact that she will be need to be casted in any lesser procedure that can be performed as noted during surgery will be performed.

## 2019-03-22 ENCOUNTER — Telehealth: Payer: Self-pay | Admitting: *Deleted

## 2019-03-22 NOTE — Telephone Encounter (Signed)
"  I saw Dr. Milinda Pointer yesterday.  I discussed surgery with him.  I had tentatively scheduled a surgery with him for November 13.  I want to confirm that I want that date."  I will get your surgery scheduled.  "Can you give me the information about phone number, what time I need to be there, and where I need to go?"  Did they give you the surgical center brochure yesterday.  "Yes, but I didn't see the phone number."  It's on the back of the brochure.  "Oh, I see it, I didn't get that far when I was looking.  They're at [place]. [employer].  That answers my questions."  You need to go online and register via the surgical center's portal.  "I saw something about that in the brochure.  I will take care of it now."

## 2019-04-06 ENCOUNTER — Telehealth: Payer: Self-pay | Admitting: Podiatry

## 2019-04-06 NOTE — Telephone Encounter (Signed)
DOS: 04/27/2019 SURGICAL PROCEDURES: Lapidus Procedure Including Bunionectomy Lt, Exc. Nail Perm. Hallux Lt, Cast Application Lt CPT CODES: 36644, [id number] DX CODES: M20.12(Acquired Hallux Valgus Lt), L60.0  Member Number: [id number]  Policy Effective : [id number]  -  06/13/9998   Name: Saundra Shelling  Date of Birth: 1960-09-24  Member Liability Summary       In-Network   Max Per Benefit Period Year-to-Date Remaining     CoInsurance         Deductible $500.00 $500.00     Out-Of-Pocket 3 $2,000.00 $2,000.00 3 Out-of-Pocket includes copay, deductible, and coinsurance.   [facility]      In-Network Copay Coinsurance Authorization Required Not Applicable [id number]  See Messages Benefit:  10/31/2018 - 06/13/9998 Utilization Management Organization: [employer] Telephone:  [phone]  No Prior Authorization or Referrals are required per Bonna S. Ref# [id number].

## 2019-04-26 ENCOUNTER — Other Ambulatory Visit: Payer: Self-pay | Admitting: Podiatry

## 2019-04-26 MED ORDER — CEPHALEXIN 500 MG PO CAPS: 500.0000 mg | ORAL_CAPSULE | Freq: Three times a day (TID) | ORAL | 0 refills | Status: DC

## 2019-04-26 MED ORDER — OXYCODONE-ACETAMINOPHEN 10-325 MG PO TABS: 1.0000 | ORAL_TABLET | Freq: Three times a day (TID) | ORAL | 0 refills | Status: AC | PRN

## 2019-04-26 MED ORDER — ONDANSETRON HCL 4 MG PO TABS: 4.0000 mg | ORAL_TABLET | Freq: Three times a day (TID) | ORAL | 0 refills | Status: DC | PRN

## 2019-04-27 ENCOUNTER — Encounter: Payer: Self-pay | Admitting: Podiatry

## 2019-04-27 DIAGNOSIS — M79675 Pain in left toe(s): Secondary | ICD-10-CM | POA: Diagnosis not present

## 2019-04-27 DIAGNOSIS — L600 Ingrowing nail: Secondary | ICD-10-CM

## 2019-04-27 DIAGNOSIS — M2022 Hallux rigidus, left foot: Secondary | ICD-10-CM

## 2019-04-28 ENCOUNTER — Telehealth: Payer: Self-pay | Admitting: Podiatry

## 2019-04-28 NOTE — Telephone Encounter (Signed)
Patient's daughter called around 61 PM on November 13,020 stating the patient was having significant pain to her foot.  The nerve block is worn off and she only taken 1 Percocet.  She has been icing and elevating.  I discussed to take ibuprofen in between the pain medicine and also she could increase the pain medicine every 4-6 hours 1 tablet if needed.  She also states the cast does feel tight.  Encouraged ice elevate.  If no relief recommend going to the urgent care or emergency room have a cast bivalved to see her first thing in the morning.  Patient's daughter verbalized understanding and no further questions.    I called the patient on November 14,020 at around 8:40am to see how she was doing.  She said that she feels much better and her pain is controlled.  She states that around midnight her pain started to improve.  She states the cast is not tight and she has no new concerns.  Overall feeling much better.  She does not feel that she needs to be seen today.  Encouraged to call back with any questions or concerns or any changes.  Celesta Gentile, DPM

## 2019-05-02 ENCOUNTER — Encounter: Payer: BLUE CROSS/BLUE SHIELD | Admitting: Podiatry

## 2019-05-02 ENCOUNTER — Ambulatory Visit: Payer: BLUE CROSS/BLUE SHIELD

## 2019-05-02 ENCOUNTER — Telehealth: Payer: Self-pay | Admitting: *Deleted

## 2019-05-02 NOTE — Telephone Encounter (Signed)
Pt's dtr, Levada Dy states pt had surgery 04/27/2019 and has not gone to the bathroom, she has had one enema and even several Doculax and is having pain.

## 2019-05-02 NOTE — Telephone Encounter (Signed)
I informed pt's dtr, Levada Dy of Dr. Stephenie Acres statement to stop the narcotics. Levada Dy states pt had mag-citrate and has had some relief. I told Levada Dy she should continue the mag-citrate or laxative until had good results or comfortable then may need to continue the stool softeners it continues the narcotic.

## 2019-05-02 NOTE — Telephone Encounter (Signed)
And stop the narcotic

## 2019-05-02 NOTE — Telephone Encounter (Signed)
I called pt's dtr, and told her at this point she should take her mtr to the PCP. Levada Dy states she called the urgent care and they said she could have another enema. I told Levada Dy, they could try that and then go to the PCP.

## 2019-05-03 NOTE — Progress Notes (Signed)
This encounter was created in error - please disregard.

## 2019-05-04 ENCOUNTER — Ambulatory Visit (INDEPENDENT_AMBULATORY_CARE_PROVIDER_SITE_OTHER): Payer: BLUE CROSS/BLUE SHIELD

## 2019-05-04 ENCOUNTER — Encounter: Payer: Self-pay | Admitting: Podiatry

## 2019-05-04 ENCOUNTER — Other Ambulatory Visit: Payer: Self-pay

## 2019-05-04 ENCOUNTER — Ambulatory Visit (INDEPENDENT_AMBULATORY_CARE_PROVIDER_SITE_OTHER): Payer: BLUE CROSS/BLUE SHIELD | Admitting: Podiatry

## 2019-05-04 VITALS — BP 101/68 | HR 77 | Temp 98.4°F

## 2019-05-04 DIAGNOSIS — M2012 Hallux valgus (acquired), left foot: Secondary | ICD-10-CM

## 2019-05-04 DIAGNOSIS — Z9889 Other specified postprocedural states: Secondary | ICD-10-CM

## 2019-05-07 ENCOUNTER — Encounter: Payer: BLUE CROSS/BLUE SHIELD | Admitting: Podiatry

## 2019-05-13 NOTE — Progress Notes (Signed)
   Subjective:  Patient presents today status post Lapidus bunionectomy left. DOS: 04/27/2019. She states she is doing well. She reports some mild soreness. She has been nonweightbearing using crutches as directed. There are no aggravating factors noted. Patient is here for further evaluation and treatment.    Past Medical History:  Diagnosis Date  . Acquired keratoderma   . Acute sinusitis, unspecified   . Allergic rhinitis   . Cancer ([place])    skin  . Dysmenorrhea   . Esophagitis   . GERD (gastroesophageal reflux disease)    h/o  not much now  . History of migraine headaches   . HLD (hyperlipidemia)   . IBS (irritable bowel syndrome)   . Obesity   . Pain in joint, lower leg   . Plantar fascial fibromatosis   . Pure hypercholesterolemia       Objective/Physical Exam Neurovascular status intact. Cast left intact.   Radiographic Exam:  Normal osseous mineralization. Joint spaces preserved. No fracture/dislocation/boney destruction.    Assessment: 1. s/p Lapidus bunionectomy left. DOS: 04/27/2019   Plan of Care:  1. Patient was evaluated. X-rays reviewed 2. Cast left intact.  3. Continue nonweightbearing with crutches.  4. Return to clinic in one week for cast change.    Edrick Kins, DPM [facility]  Dr. Edrick Kins, [address]                                        [place], [place] [address]                Office [phone]  Fax [phone]

## 2019-05-16 ENCOUNTER — Encounter: Payer: Self-pay | Admitting: Podiatry

## 2019-05-16 ENCOUNTER — Ambulatory Visit (INDEPENDENT_AMBULATORY_CARE_PROVIDER_SITE_OTHER): Payer: BLUE CROSS/BLUE SHIELD | Admitting: Podiatry

## 2019-05-16 ENCOUNTER — Other Ambulatory Visit: Payer: Self-pay

## 2019-05-16 DIAGNOSIS — Z9889 Other specified postprocedural states: Secondary | ICD-10-CM

## 2019-05-16 DIAGNOSIS — M2022 Hallux rigidus, left foot: Secondary | ICD-10-CM | POA: Diagnosis not present

## 2019-05-16 NOTE — Progress Notes (Signed)
She presents today date of surgery 04/27/2019 status post Lapidus bunion repair with nail avulsion.  Denies fever chills nausea from muscle aches pains calf pain back pain chest pain shortness of breath states that she has not been walking on her cast.  Objective: Vital signs stable she is alert and oriented x3 cast is intact was removed today demonstrates rectus first metatarsal phalangeal joint and nail avulsion.  She has good range of motion dorsiflexion plantarflexion of the joint.  I reviewed radiographs confirming that is in good position.  Assessment: Well-healing surgical foot.  Plan: Redressed her today and recast her.  Follow-up with her in 2 weeks

## 2019-05-21 ENCOUNTER — Encounter: Payer: BLUE CROSS/BLUE SHIELD | Admitting: Podiatry

## 2019-05-30 ENCOUNTER — Other Ambulatory Visit: Payer: Self-pay

## 2019-05-30 ENCOUNTER — Ambulatory Visit (INDEPENDENT_AMBULATORY_CARE_PROVIDER_SITE_OTHER): Payer: BLUE CROSS/BLUE SHIELD | Admitting: Podiatry

## 2019-05-30 ENCOUNTER — Ambulatory Visit (INDEPENDENT_AMBULATORY_CARE_PROVIDER_SITE_OTHER): Payer: BLUE CROSS/BLUE SHIELD

## 2019-05-30 DIAGNOSIS — M2012 Hallux valgus (acquired), left foot: Secondary | ICD-10-CM

## 2019-05-30 DIAGNOSIS — Z9889 Other specified postprocedural states: Secondary | ICD-10-CM

## 2019-05-30 NOTE — Progress Notes (Signed)
She presents today nearly 1 month status post Lapidus procedure left foot.  She denies fever chills nausea vomiting muscle aches and pains presents utilizing her crutches.  No chest pain or calf pain.  Objective: Cast is intact dry and clean once removed demonstrates minimal edema no erythema cellulitis drainage or odor she has good range of motion of the first metatarsophalangeal joint left dry xerotic skin secondary to cast.  No calf pain.  Assessment: Well-healing surgical foot.  Plan: Placed her in a compression anklet after the sutures were removed.  Placed her back in her cam walker she is to remain nonweightbearing for the next 2 weeks I will follow-up with her at that time

## 2019-05-31 ENCOUNTER — Telehealth: Payer: Self-pay | Admitting: Family Medicine

## 2019-05-31 NOTE — Telephone Encounter (Signed)
That is fine  She will need a virtual appointment for the respiratory symptoms.  Then we can plan an in person to get re est when she gets better  Thanks  Sending to [employer]

## 2019-05-31 NOTE — Telephone Encounter (Signed)
Schedule appointment 2/18 @ 4:15 This was the only 30 min appointment you had tomorrow

## 2019-05-31 NOTE — Telephone Encounter (Signed)
That is fine  Since that is late in the day and she has respiratory symptoms  - could someone give her the info to schedule a covid test (if she wants to)- just because it will be too late for the weekend by the time I talk to her  It is entirely up to her -but wanted to put it out there as an option  Thanks !

## 2019-05-31 NOTE — Telephone Encounter (Signed)
Pt aware pt declined she thinks she just has chest cold

## 2019-05-31 NOTE — Telephone Encounter (Signed)
Pt called to schedule appointment She stated with headcold  Thanksgiving.  She now  has chest congestion and cough.  She is having hard time checking her breathing when coughing She stated she has tried several things over the counter  Pt last appointment with was 11/2015.  Is it ok to schedule??

## 2019-06-01 ENCOUNTER — Ambulatory Visit (INDEPENDENT_AMBULATORY_CARE_PROVIDER_SITE_OTHER): Payer: BC Managed Care – PPO | Admitting: Family Medicine

## 2019-06-01 ENCOUNTER — Other Ambulatory Visit: Payer: Self-pay

## 2019-06-01 ENCOUNTER — Encounter: Payer: Self-pay | Admitting: Family Medicine

## 2019-06-01 DIAGNOSIS — J209 Acute bronchitis, unspecified: Secondary | ICD-10-CM | POA: Diagnosis not present

## 2019-06-01 MED ORDER — HYDROCODONE-HOMATROPINE 5-1.5 MG/5ML PO SYRP: 5.0000 mL | ORAL_SOLUTION | Freq: Three times a day (TID) | ORAL | 0 refills | Status: DC | PRN

## 2019-06-01 MED ORDER — PREDNISONE 10 MG PO TABS: ORAL_TABLET | ORAL | 0 refills | Status: DC

## 2019-06-01 NOTE — Progress Notes (Signed)
Virtual Visit via Video Note  I connected with Meredith [name] on 06/01/19 at  4:15 PM EST by a video enabled telemedicine application and verified that I am speaking with the correct person using two identifiers.  Location: Patient: car Provider: office    I discussed the limitations of evaluation and management by telemedicine and the availability of in person appointments. The patient expressed understanding and agreed to proceed.  Parties involved in encounter  Patient: Meredith [name]   Provider:  Loura Pardon MD   History of Present Illness: Pt presents with uri symptoms   Started 3 weeks ago as a head cold  Head congestion and cough Just cannot get rid of the cough   Cough is just a little wet  Rattling  A little tight after a coughing bout (not wheezing a lot)  Not unusual for her   Mucous is generally clear to white   No fever  No chills or body aches  No loss of taste or smell  No n/v/d    Has taken mucinex - (just bought the DM yesterday)  Alka selzer cough medicine   Did not get covid test   No head symptoms   Has not had any public exposure  Works from home  She did see DIL once -she had uri symptoms /bronchitis - she did get neg test for covid   Patient Active Problem List   Diagnosis Date Noted  . Acute bronchitis 06/01/2019  . Viral URI with cough 11/17/2015  . Migraine 05/30/2015  . Cough 05/03/2014  . Impacted cerumen of right ear 02/06/2014  . ETD (eustachian tube dysfunction) 02/06/2014  . Vertigo 05/17/2013  . Perimenopause 01/05/2013  . Menorrhagia 01/05/2013  . Lumbar degenerative disc disease 03/08/2012  . SOB (shortness of breath) 01/21/2011  . Chest pain 01/12/2011  . Hypocalcemia 01/12/2011  . Leukocytosis 01/12/2011  . Other screening mammogram 01/12/2011  . Gynecological examination 01/12/2011  . Routine general medical examination at a health care facility 01/03/2011  . Sciatica 11/20/2010  . Neck pain 11/20/2010   . PLANTAR FASCIITIS, BILATERAL 08/28/2009  . Pain in joint, lower leg 06/17/2009  . KERATOSIS 05/25/2007  . HYPERCHOLESTEROLEMIA 10/13/2006  . ALLERGIC RHINITIS 10/13/2006  . GERD 10/13/2006  . IBS 10/13/2006  . DYSMENORRHEA 10/13/2006  . MIGRAINES, HX OF 10/13/2006   Past Medical History:  Diagnosis Date  . Acquired keratoderma   . Acute sinusitis, unspecified   . Allergic rhinitis   . Cancer ([place])    skin  . Dysmenorrhea   . Esophagitis   . GERD (gastroesophageal reflux disease)    h/o  not much now  . History of migraine headaches   . HLD (hyperlipidemia)   . IBS (irritable bowel syndrome)   . Obesity   . Pain in joint, lower leg   . Plantar fascial fibromatosis   . Pure hypercholesterolemia    Past Surgical History:  Procedure Laterality Date  . COLONOSCOPY    . SHOULDER ARTHROSCOPY WITH OPEN ROTATOR CUFF REPAIR Right 11/27/2015   Procedure: SHOULDER ARTHROSCOPY WITH OPEN ROTATOR CUFF REPAIR;  Surgeon: Thornton Park, MD;  Location: [facility] ORS;  Service: Orthopedics;  Laterality: Right;  . treadmill stress test  01/13/11   Social History   Tobacco Use  . Smoking status: Never Smoker  . Smokeless tobacco: Never Used  Substance Use Topics  . Alcohol use: Yes    Alcohol/week: 0.0 standard drinks    Comment: Occasional  . Drug use: No  Family History  Problem Relation Age of Onset  . Heart attack Mother   . Hypertension Mother   . Lung cancer Father        + smoker  . GER disease Father        esophageal stricture  . Hypertension Brother   . Other Sister 4       MAC disease   No Known Allergies Current Outpatient Medications on File Prior to Visit  Medication Sig Dispense Refill  . naproxen sodium (ALEVE) 220 MG tablet Take 220 mg by mouth.     No current facility-administered medications on file prior to visit.   Review of Systems  Constitutional: Negative for chills, fever and malaise/fatigue.  HENT: Positive for congestion. Negative for ear pain,  sinus pain and sore throat.   Eyes: Negative for blurred vision, discharge and redness.  Respiratory: Positive for cough, sputum production and wheezing. Negative for shortness of breath and stridor.        Scant if any wheezing  Cardiovascular: Negative for chest pain, palpitations and leg swelling.  Gastrointestinal: Negative for abdominal pain, diarrhea, nausea and vomiting.  Musculoskeletal: Negative for myalgias.  Skin: Negative for rash.  Neurological: Negative for dizziness and headaches.      Observations/Objective: Patient appears well, in no distress Weight is baseline  No facial swelling or asymmetry Voice is slightly hoars  No obvious tremor or mobility impairment Moving neck and UEs normally Able to hear the call well  Mild wet sounding cough noted, no sob or wheezing during interview  Talkative and mentally sharp with no cognitive changes No skin changes on face or neck , no rash or pallor Affect is normal    Assessment and Plan: Problem List Items Addressed This Visit      Respiratory   Acute bronchitis    With persistent cough after uri and some chest tightness  Disc option for covid testing (she already isolates) and info given to schedule  Px prednisone for reactive airway Hycodan (use with caution of sedation) for cough  inst to isolate until symptoms resolve/test result returns if she gets tested  Update if not starting to improve in a week or if worsening            Follow Up Instructions: Use the info to get a covid test next week if you want to  Isolate yourself until symptoms are better either way   Alert [place] is symptoms worsen    I discussed the assessment and treatment plan with the patient. The patient was provided an opportunity to ask questions and all were answered. The patient agreed with the plan and demonstrated an understanding of the instructions.   The patient was advised to call back or seek an in-person evaluation if the symptoms  worsen or if the condition fails to improve as anticipated.     Loura Pardon, MD

## 2019-06-01 NOTE — Patient Instructions (Signed)
Use the info to get a covid test next week if you want to  Isolate yourself until symptoms are better either way   Alert [place] is symptoms worsen

## 2019-06-03 NOTE — Assessment & Plan Note (Signed)
With persistent cough after uri and some chest tightness  Disc option for covid testing (she already isolates) and info given to schedule  Px prednisone for reactive airway Hycodan (use with caution of sedation) for cough  inst to isolate until symptoms resolve/test result returns if she gets tested  Update if not starting to improve in a week or if worsening

## 2019-06-04 ENCOUNTER — Encounter: Payer: BLUE CROSS/BLUE SHIELD | Admitting: Podiatry

## 2019-06-13 ENCOUNTER — Encounter: Payer: Self-pay | Admitting: Podiatry

## 2019-06-13 ENCOUNTER — Ambulatory Visit: Payer: BLUE CROSS/BLUE SHIELD | Admitting: Podiatry

## 2019-06-13 ENCOUNTER — Other Ambulatory Visit: Payer: Self-pay

## 2019-06-13 ENCOUNTER — Ambulatory Visit (INDEPENDENT_AMBULATORY_CARE_PROVIDER_SITE_OTHER): Payer: BC Managed Care – PPO

## 2019-06-13 DIAGNOSIS — Z9889 Other specified postprocedural states: Secondary | ICD-10-CM

## 2019-06-13 DIAGNOSIS — M2012 Hallux valgus (acquired), left foot: Secondary | ICD-10-CM | POA: Diagnosis not present

## 2019-06-13 NOTE — Progress Notes (Signed)
She presents today for follow-up of her Lapidus procedure including bunionectomy with excision of the nail plate hallux left.  She states that is doing great is a little stiff and then noticed some bruising around the toe on Christmas Day.  Denies any trauma.  Denies fever chills nausea vomiting muscle aches pains calf pain back pain chest pain or shortness of breath.  She states that she has been standing on it statically he has not been walking.  Using crutches nonweightbearing transport.  Objective: Vital signs are stable she is alert oriented x3 presents nonweightbearing utilizing crutches and Cam walker.  Was CAM Gilford Rile was removed demonstrates no erythema cellulitis drainage odor all the sutures have been removed everything appears to be healing very nicely.  She has good range of motion on active range of motion as well as passive.  No open lesions or wounds.  Assessment: Well-healing surgical foot.  Plan: I would allow her to start getting this wet washing it and putting lotion on it she will continue the use of the cam walker but were going to start partial weightbearing today progress immediately to full weightbearing with gait.  I will follow-up with her in 2 weeks for a tennis shoe.  X-rays will be performed at that time.

## 2019-06-18 ENCOUNTER — Telehealth: Payer: Self-pay

## 2019-06-18 NOTE — Telephone Encounter (Signed)
Pt had a virtual appt with Dr Glori Bickers on 06/01/19. When pt finished meds thought was better but now pt feels just like she did prior to virtual appt. Pt feels like has bad congestion in her chest and a cough. Pt did go to [facility] this morning for covid testing. Pt will go to [facility] for eval and possible CXR.  FYI to Dr Glori Bickers.

## 2019-06-18 NOTE — Telephone Encounter (Signed)
I agree with that advisement and will wait for correspondence

## 2019-06-27 ENCOUNTER — Ambulatory Visit (INDEPENDENT_AMBULATORY_CARE_PROVIDER_SITE_OTHER): Payer: BC Managed Care – PPO

## 2019-06-27 ENCOUNTER — Ambulatory Visit (INDEPENDENT_AMBULATORY_CARE_PROVIDER_SITE_OTHER): Payer: BC Managed Care – PPO | Admitting: Podiatry

## 2019-06-27 ENCOUNTER — Other Ambulatory Visit: Payer: Self-pay

## 2019-06-27 ENCOUNTER — Encounter: Payer: Self-pay | Admitting: Podiatry

## 2019-06-27 DIAGNOSIS — Z9889 Other specified postprocedural states: Secondary | ICD-10-CM

## 2019-06-27 DIAGNOSIS — M2012 Hallux valgus (acquired), left foot: Secondary | ICD-10-CM

## 2019-06-27 NOTE — Progress Notes (Signed)
She presents today date of surgery 04/27/2019 status post 2 months now Lapidus procedure and nail avulsion left foot.  States that is feeling good no problems with it.  Continues to utilize her cam walker.  Objective: Presents with her cam walker today vital signs stable alert oriented x3 pulses are palpable.  He has great range of motion first metatarsophalangeal joint of the left foot.  Radiographs taken today demonstrate well healing arthrodesis.  Assessment: Well-healing surgical foot.  Plan: I highly recommended range of motion exercises.  Her getting back in her regular shoe gear and I will follow-up with her in about 1 month.

## 2019-07-30 ENCOUNTER — Encounter: Payer: Self-pay | Admitting: Podiatry

## 2019-07-30 ENCOUNTER — Ambulatory Visit (INDEPENDENT_AMBULATORY_CARE_PROVIDER_SITE_OTHER): Payer: BC Managed Care – PPO

## 2019-07-30 ENCOUNTER — Ambulatory Visit (INDEPENDENT_AMBULATORY_CARE_PROVIDER_SITE_OTHER): Payer: BC Managed Care – PPO | Admitting: Podiatry

## 2019-07-30 ENCOUNTER — Other Ambulatory Visit: Payer: Self-pay

## 2019-07-30 DIAGNOSIS — Z9889 Other specified postprocedural states: Secondary | ICD-10-CM

## 2019-07-30 DIAGNOSIS — M2012 Hallux valgus (acquired), left foot: Secondary | ICD-10-CM

## 2019-07-30 NOTE — Progress Notes (Signed)
She presents today date of surgery 04/27/2019 status post Lapidus procedure including bunionectomy with excision nail hallux left.  States that I feel like there is a knot beneath the second metatarsophalangeal joint of the ball of my foot.  But she has been wearing some older tennis shoes and states that it is bothers her with bare feet and feels better with tennis shoes on.  Objective: Vital signs are stable she alert and oriented x3.  Hallux is rectus she does not have a prominent second metatarsophalangeal joint that I can feel.  Radiographically appears to be healing very nicely.  Very mild extensiveness at the level of the first metatarsophalangeal joint.  Assessment: Well-healing surgical foot.  Plan: Follow-up with me on an as-needed basis may need to consider orthotics.

## 2019-10-10 ENCOUNTER — Ambulatory Visit: Payer: BC Managed Care – PPO | Admitting: Podiatry

## 2019-10-24 ENCOUNTER — Encounter: Payer: BC Managed Care – PPO | Admitting: Dermatology

## 2019-12-26 ENCOUNTER — Telehealth: Payer: Self-pay | Admitting: Family Medicine

## 2019-12-26 DIAGNOSIS — Z0000 Encounter for general adult medical examination without abnormal findings: Secondary | ICD-10-CM

## 2019-12-26 DIAGNOSIS — E7800 Pure hypercholesterolemia, unspecified: Secondary | ICD-10-CM

## 2019-12-26 NOTE — Telephone Encounter (Signed)
-----   Message from Ellamae Sia sent at 12/14/2019 11:47 AM EDT ----- Regarding: Lab orders for Thursday, 7.15.21 Patient is scheduled for CPX labs, please order future labs, Thanks , Karna Christmas

## 2019-12-27 ENCOUNTER — Other Ambulatory Visit (INDEPENDENT_AMBULATORY_CARE_PROVIDER_SITE_OTHER): Payer: 59

## 2019-12-27 ENCOUNTER — Other Ambulatory Visit: Payer: Self-pay

## 2019-12-27 DIAGNOSIS — E7800 Pure hypercholesterolemia, unspecified: Secondary | ICD-10-CM

## 2019-12-27 DIAGNOSIS — Z0000 Encounter for general adult medical examination without abnormal findings: Secondary | ICD-10-CM

## 2019-12-27 LAB — LIPID PANEL
Cholesterol: 209 mg/dL — ABNORMAL HIGH (ref 0–200)
HDL: 68.7 mg/dL (ref >39.00)
LDL Cholesterol: 121 mg/dL — ABNORMAL HIGH (ref 0–99)
NonHDL: 140.49
Total CHOL/HDL Ratio: 3
Triglycerides: 95 mg/dL (ref 0.0–149.0)
VLDL: 19 mg/dL (ref 0.0–40.0)

## 2019-12-27 LAB — COMPREHENSIVE METABOLIC PANEL
ALT: 29 U/L (ref 0–35)
AST: 36 U/L (ref 0–37)
Albumin: 4.4 g/dL (ref 3.5–5.2)
Alkaline Phosphatase: 79 U/L (ref 39–117)
BUN: 9 mg/dL (ref 6–23)
CO2: 29 mEq/L (ref 19–32)
Calcium: 8.9 mg/dL (ref 8.4–10.5)
Chloride: 104 mEq/L (ref 96–112)
Creatinine, Ser: 0.74 mg/dL (ref 0.40–1.20)
GFR: 80.41 mL/min (ref >60.00)
Glucose, Bld: 106 mg/dL — ABNORMAL HIGH (ref 70–99)
Potassium: 4.3 mEq/L (ref 3.5–5.1)
Sodium: 139 mEq/L (ref 135–145)
Total Bilirubin: 0.3 mg/dL (ref 0.2–1.2)
Total Protein: 6.8 g/dL (ref 6.0–8.3)

## 2019-12-27 LAB — CBC WITH DIFFERENTIAL/PLATELET
Basophils Absolute: 0 10*3/uL (ref 0.0–0.1)
Basophils Relative: 0.6 % (ref 0.0–3.0)
Eosinophils Absolute: 0 10*3/uL (ref 0.0–0.7)
Eosinophils Relative: 0.1 % (ref 0.0–5.0)
HCT: 38 % (ref 36.0–46.0)
Hemoglobin: 12.8 g/dL (ref 12.0–15.0)
Lymphocytes Relative: 36.5 % (ref 12.0–46.0)
Lymphs Abs: 1.4 10*3/uL (ref 0.7–4.0)
MCHC: 33.6 g/dL (ref 30.0–36.0)
MCV: 98.4 fl (ref 78.0–100.0)
Monocytes Absolute: 0.5 10*3/uL (ref 0.1–1.0)
Monocytes Relative: 13.2 % — ABNORMAL HIGH (ref 3.0–12.0)
Neutro Abs: 1.9 10*3/uL (ref 1.4–7.7)
Neutrophils Relative %: 49.6 % (ref 43.0–77.0)
Platelets: 160 10*3/uL (ref 150.0–400.0)
RBC: 3.86 Mil/uL — ABNORMAL LOW (ref 3.87–5.11)
RDW: 13.4 % (ref 11.5–15.5)
WBC: 3.9 10*3/uL — ABNORMAL LOW (ref 4.0–10.5)

## 2019-12-27 LAB — TSH: TSH: 2.23 u[IU]/mL (ref 0.35–4.50)

## 2020-01-01 ENCOUNTER — Encounter: Payer: Self-pay | Admitting: Family Medicine

## 2020-01-01 ENCOUNTER — Ambulatory Visit (INDEPENDENT_AMBULATORY_CARE_PROVIDER_SITE_OTHER): Payer: 59 | Admitting: Family Medicine

## 2020-01-01 ENCOUNTER — Other Ambulatory Visit: Payer: Self-pay

## 2020-01-01 VITALS — BP 110/62 | HR 68 | Temp 96.9°F | Ht 66.75 in | Wt 162.1 lb

## 2020-01-01 DIAGNOSIS — Z1211 Encounter for screening for malignant neoplasm of colon: Secondary | ICD-10-CM | POA: Insufficient documentation

## 2020-01-01 DIAGNOSIS — E7800 Pure hypercholesterolemia, unspecified: Secondary | ICD-10-CM

## 2020-01-01 DIAGNOSIS — N941 Unspecified dyspareunia: Secondary | ICD-10-CM | POA: Insufficient documentation

## 2020-01-01 DIAGNOSIS — Z0000 Encounter for general adult medical examination without abnormal findings: Secondary | ICD-10-CM

## 2020-01-01 DIAGNOSIS — R7309 Other abnormal glucose: Secondary | ICD-10-CM | POA: Diagnosis not present

## 2020-01-01 NOTE — Assessment & Plan Note (Signed)
Suspect vaginal atrophy  Ref to gyn  Pt would like to disc opt for tx

## 2020-01-01 NOTE — Assessment & Plan Note (Signed)
Reviewed health habits including diet and exercise and skin cancer prevention Reviewed appropriate screening tests for age  Also reviewed health mt list, fam hx and immunization status , as well as social and family history   See HPI Labs reviewed  Ref to gyn for routine exam and also atrophic vaginal changes  Given number to schedule her own mammogram  Needs Td -will return in sept a month after finishing covid vaccines (2nd covid vaccine is in august) Colonoscopy due- ref made/pt prefers October if possible Enc her to get flu shot every fall  Disc shingrix -pt doubts ins will cover this  HDL cholesterol is up - commended  Will watch glucose

## 2020-01-01 NOTE — Progress Notes (Signed)
Subjective:    Patient ID: Meredith [name], female    DOB: 07-18-1960, 59 y.o.   MRN: [id number]  This visit occurred during the SARS-CoV-2 public health emergency.  Safety protocols were in place, including screening questions prior to the visit, additional usage of staff PPE, and extensive cleaning of exam room while observing appropriate contact time as indicated for disinfecting solutions.    HPI Here for health maintenance exam and to review chronic medical problems    Wt Readings from Last 3 Encounters:  01/01/20 162 lb 1 oz (73.5 kg)  06/01/19 145 lb (65.8 kg)  11/17/15 184 lb 8 oz (83.7 kg)   25.57 kg/m  Going to a water aerobics class every day  She got off track with eating and gained weight  Her husband developed an  Aneurysm and she has to care for him Not much time for self care   Taking zyrtec for allergies   She is easing into better diet   Pap 7/13  Has not been to a gyn -wants to be set up with one   No menses- menopausal  Having discomfort with intercourse  Never went through hot flashes /night sweats -not bad at all   Mammogram 7/16-[place]  Self breast exam -no breast lumps   Td 9/07 No exp to infants   Colonoscopy 9/09 Due for colonoscopy  Interested in doing later in the fall   Flu shot- used to get them  covid status-immunized with first-getting 2nd in early august  Zoster status - not immunized (doubts her ins will cover)   BP Readings from Last 3 Encounters:  01/01/20 110/62  05/04/19 101/68  11/27/15 136/80   Pulse Readings from Last 3 Encounters:  01/01/20 68  05/04/19 77  11/27/15 86    Cholesterol Lab Results  Component Value Date   CHOL 209 (H) 12/27/2019   CHOL 215 (H) 11/06/2014   CHOL 212 (H) 03/01/2012   Lab Results  Component Value Date   HDL 68.70 12/27/2019   HDL 58.20 11/06/2014   HDL 46.80 03/01/2012   Lab Results  Component Value Date   LDLCALC 121 (H) 12/27/2019   LDLCALC 124 (H) 11/06/2014    LDLCALC 166 (H) 12/24/2011   Lab Results  Component Value Date   TRIG 95.0 12/27/2019   TRIG 162.0 (H) 11/06/2014   TRIG 125.0 03/01/2012   Lab Results  Component Value Date   CHOLHDL 3 12/27/2019   CHOLHDL 4 11/06/2014   CHOLHDL 5 03/01/2012   Lab Results  Component Value Date   LDLDIRECT 140.4 03/01/2012   LDLDIRECT 133.2 01/07/2011   LDLDIRECT 142.0 09/19/2009   HDL is up  Is exercising   Other labs Results for orders placed or performed in visit on 12/27/19  TSH  Result Value Ref Range   TSH 2.23 0.35 - 4.50 uIU/mL  Lipid panel  Result Value Ref Range   Cholesterol 209 (H) 0 - 200 mg/dL   Triglycerides 95.0 0 - 149 mg/dL   HDL 68.70 >39.00 mg/dL   VLDL 19.0 0.0 - 40.0 mg/dL   LDL Cholesterol 121 (H) 0 - 99 mg/dL   Total CHOL/HDL Ratio 3    NonHDL 140.49   Comprehensive metabolic panel  Result Value Ref Range   Sodium 139 135 - 145 mEq/L   Potassium 4.3 3.5 - 5.1 mEq/L   Chloride 104 96 - 112 mEq/L   CO2 29 19 - 32 mEq/L   Glucose, Bld 106 (H) 70 -  99 mg/dL   BUN 9 6 - 23 mg/dL   Creatinine, Ser 0.74 0.40 - 1.20 mg/dL   Total Bilirubin 0.3 0.2 - 1.2 mg/dL   Alkaline Phosphatase 79 39 - 117 U/L   AST 36 0 - 37 U/L   ALT 29 0 - 35 U/L   Total Protein 6.8 6.0 - 8.3 g/dL   Albumin 4.4 3.5 - 5.2 g/dL   GFR 80.41 >60.00 mL/min   Calcium 8.9 8.4 - 10.5 mg/dL  CBC with Differential/Platelet  Result Value Ref Range   WBC 3.9 (L) 4.0 - 10.5 K/uL   RBC 3.86 (L) 3.87 - 5.11 Mil/uL   Hemoglobin 12.8 12.0 - 15.0 g/dL   HCT 38.0 36 - 46 %   MCV 98.4 78.0 - 100.0 fl   MCHC 33.6 30.0 - 36.0 g/dL   RDW 13.4 11.5 - 15.5 %   Platelets 160.0 150 - 400 K/uL   Neutrophils Relative % 49.6 43 - 77 %   Lymphocytes Relative 36.5 12 - 46 %   Monocytes Relative 13.2 (H) 3 - 12 %   Eosinophils Relative 0.1 0 - 5 %   Basophils Relative 0.6 0 - 3 %   Neutro Abs 1.9 1.4 - 7.7 K/uL   Lymphs Abs 1.4 0.7 - 4.0 K/uL   Monocytes Absolute 0.5 0 - 1 K/uL   Eosinophils Absolute 0.0  0 - 0 K/uL   Basophils Absolute 0.0 0 - 0 K/uL     Glucose 106 fasting  No DM symptoms   Patient Active Problem List   Diagnosis Date Noted  . Dyspareunia in female 01/01/2020  . Colon cancer screening 01/01/2020  . Elevated glucose level 01/01/2020  . Migraine 05/30/2015  . Vertigo 05/17/2013  . Lumbar degenerative disc disease 03/08/2012  . Other screening mammogram 01/12/2011  . Gynecological examination 01/12/2011  . Routine general medical examination at a health care facility 01/03/2011  . Sciatica 11/20/2010  . Neck pain 11/20/2010  . PLANTAR FASCIITIS, BILATERAL 08/28/2009  . HYPERCHOLESTEROLEMIA 10/13/2006  . ALLERGIC RHINITIS 10/13/2006  . GERD 10/13/2006  . IBS 10/13/2006  . MIGRAINES, HX OF 10/13/2006   Past Medical History:  Diagnosis Date  . Acquired keratoderma   . Acute sinusitis, unspecified   . Allergic rhinitis   . Cancer ([place])    skin  . Dysmenorrhea   . Esophagitis   . GERD (gastroesophageal reflux disease)    h/o  not much now  . History of migraine headaches   . HLD (hyperlipidemia)   . IBS (irritable bowel syndrome)   . Obesity   . Pain in joint, lower leg   . Plantar fascial fibromatosis   . Pure hypercholesterolemia    Past Surgical History:  Procedure Laterality Date  . COLONOSCOPY    . SHOULDER ARTHROSCOPY WITH OPEN ROTATOR CUFF REPAIR Right 11/27/2015   Procedure: SHOULDER ARTHROSCOPY WITH OPEN ROTATOR CUFF REPAIR;  Surgeon: Thornton Park, MD;  Location: [facility] ORS;  Service: Orthopedics;  Laterality: Right;  . treadmill stress test  01/13/11   Social History   Tobacco Use  . Smoking status: Never Smoker  . Smokeless tobacco: Never Used  Substance Use Topics  . Alcohol use: Yes    Alcohol/week: 0.0 standard drinks    Comment: Occasional  . Drug use: No   Family History  Problem Relation Age of Onset  . Heart attack Mother   . Hypertension Mother   . Lung cancer Father        +  smoker  . GER disease Father         esophageal stricture  . Hypertension Brother   . Other Sister 86       MAC disease   No Known Allergies Current Outpatient Medications on File Prior to Visit  Medication Sig Dispense Refill  . naproxen sodium (ALEVE) 220 MG tablet Take 220 mg by mouth daily as needed.      No current facility-administered medications on file prior to visit.    Review of Systems  Constitutional: Negative for activity change, appetite change, fatigue, fever and unexpected weight change.  HENT: Negative for congestion, ear pain, rhinorrhea, sinus pressure and sore throat.   Eyes: Negative for pain, redness and visual disturbance.  Respiratory: Negative for cough, shortness of breath and wheezing.   Cardiovascular: Negative for chest pain and palpitations.  Gastrointestinal: Negative for abdominal pain, blood in stool, constipation and diarrhea.  Endocrine: Negative for polydipsia and polyuria.  Genitourinary: Positive for dyspareunia. Negative for dysuria, frequency and urgency.  Musculoskeletal: Negative for arthralgias, back pain and myalgias.  Skin: Negative for pallor and rash.  Allergic/Immunologic: Negative for environmental allergies.  Neurological: Negative for dizziness, syncope and headaches.  Hematological: Negative for adenopathy. Does not bruise/bleed easily.  Psychiatric/Behavioral: Negative for decreased concentration and dysphoric mood. The patient is not nervous/anxious.        Objective:   Physical Exam Constitutional:      General: She is not in acute distress.    Appearance: Normal appearance. She is well-developed and normal weight. She is not ill-appearing or diaphoretic.  HENT:     Head: Normocephalic and atraumatic.     Right Ear: Tympanic membrane, ear canal and external ear normal.     Left Ear: Tympanic membrane, ear canal and external ear normal.     Nose: Nose normal. No congestion.     Mouth/Throat:     Mouth: Mucous membranes are moist.     Pharynx: Oropharynx is  clear. No posterior oropharyngeal erythema.  Eyes:     General: No scleral icterus.    Extraocular Movements: Extraocular movements intact.     Conjunctiva/sclera: Conjunctivae normal.     Pupils: Pupils are equal, round, and reactive to light.  Neck:     Thyroid: No thyromegaly.     Vascular: No carotid bruit or JVD.  Cardiovascular:     Rate and Rhythm: Normal rate and regular rhythm.     Pulses: Normal pulses.     Heart sounds: Normal heart sounds. No gallop.   Pulmonary:     Effort: Pulmonary effort is normal. No respiratory distress.     Breath sounds: Normal breath sounds. No wheezing.     Comments: Good air exch Chest:     Chest wall: No tenderness.  Abdominal:     General: Bowel sounds are normal. There is no distension or abdominal bruit.     Palpations: Abdomen is soft. There is no mass.     Tenderness: There is no abdominal tenderness.     Hernia: No hernia is present.  Genitourinary:    Comments: Breast exam: No mass, nodules, thickening, tenderness, bulging, retraction, inflamation, nipple discharge or skin changes noted.  No axillary or clavicular LA.     Musculoskeletal:        General: No tenderness. Normal range of motion.     Cervical back: Normal range of motion and neck supple. No rigidity. No muscular tenderness.     Right lower leg: No edema.  Left lower leg: No edema.     Comments: No kyphosis   Lymphadenopathy:     Cervical: No cervical adenopathy.  Skin:    General: Skin is warm and dry.     Coloration: Skin is not pale.     Findings: No erythema or rash.     Comments: Tanned Some aks  Solar lentigines diffusely  Erythematous papular rash on chest- appears to be dermatitis /poss allergic  Neurological:     Mental Status: She is alert. Mental status is at baseline.     Cranial Nerves: No cranial nerve deficit.     Motor: No abnormal muscle tone.     Coordination: Coordination normal.     Gait: Gait normal.     Deep Tendon Reflexes: Reflexes  are normal and symmetric. Reflexes normal.  Psychiatric:        Mood and Affect: Mood normal.        Cognition and Memory: Cognition and memory normal.           Assessment & Plan:   Problem List Items Addressed This Visit      Other   HYPERCHOLESTEROLEMIA    Stable cholesterol with inc HDL and better ratio Disc goals for lipids and reasons to control them Rev last labs with pt Rev low sat fat diet in detail Enc to keep watching diet and exercising      Routine general medical examination at a health care facility - Primary    Reviewed health habits including diet and exercise and skin cancer prevention Reviewed appropriate screening tests for age  Also reviewed health mt list, fam hx and immunization status , as well as social and family history   See HPI Labs reviewed  Ref to gyn for routine exam and also atrophic vaginal changes  Given number to schedule her own mammogram  Needs Td -will return in sept a month after finishing covid vaccines (2nd covid vaccine is in august) Colonoscopy due- ref made/pt prefers October if possible Enc her to get flu shot every fall  Disc shingrix -pt doubts ins will cover this  HDL cholesterol is up - commended  Will watch glucose         Dyspareunia in female    Suspect vaginal atrophy  Ref to gyn  Pt would like to disc opt for tx       Relevant Orders   Ambulatory referral to Gynecology   Colon cancer screening    Due for colonoscopy  Ref done  Pt wants to get this in oct      Relevant Orders   Ambulatory referral to Gastroenterology   Elevated glucose level    106 fasting Will plan a1c next time  disc imp of low glycemic diet and wt loss to prevent DM2

## 2020-01-01 NOTE — Assessment & Plan Note (Signed)
106 fasting Will plan a1c next time  disc imp of low glycemic diet and wt loss to prevent DM2

## 2020-01-01 NOTE — Assessment & Plan Note (Signed)
Stable cholesterol with inc HDL and better ratio Disc goals for lipids and reasons to control them Rev last labs with pt Rev low sat fat diet in detail Enc to keep watching diet and exercising

## 2020-01-01 NOTE — Assessment & Plan Note (Signed)
Due for colonoscopy  Ref done  Pt wants to get this in oct

## 2020-01-01 NOTE — Patient Instructions (Addendum)
Try to get most of your carbohydrates from produce (with the exception of white potatoes)  Eat less bread/pasta/rice/snack foods/cereals/sweets and other items from the middle of the grocery store (processed carbs) Keep up the exercise   I placed a gyn referral  Our office will call you to set that up   Make sure to schedule your mammogram at [facility] back to flu shots every fall   You are due for a tetanus shot    If you are interested in the shingles vaccine series (Shingrix), call your insurance or pharmacy to check on coverage and location it must be given.  If affordable - you can schedule it here or at your pharmacy depending on coverage   For cholesterol Avoid red meat/ fried foods/ egg yolks/ fatty breakfast meats/ butter, cheese and high fat dairy/ and shellfish    Do schedule a dermatology appt. For skin screening exam   Make sure to get your 2nd covid vaccine

## 2020-01-29 ENCOUNTER — Other Ambulatory Visit: Payer: Self-pay | Admitting: Family Medicine

## 2020-01-29 DIAGNOSIS — Z1231 Encounter for screening mammogram for malignant neoplasm of breast: Secondary | ICD-10-CM

## 2020-02-13 ENCOUNTER — Other Ambulatory Visit: Payer: Self-pay

## 2020-02-13 ENCOUNTER — Ambulatory Visit (INDEPENDENT_AMBULATORY_CARE_PROVIDER_SITE_OTHER): Payer: 59

## 2020-02-13 DIAGNOSIS — Z23 Encounter for immunization: Secondary | ICD-10-CM

## 2020-02-13 NOTE — Progress Notes (Signed)
Per orders of Dr. Glori Bickers, injection of Td given by Randall An. Patient tolerated injection well.

## 2020-02-19 ENCOUNTER — Ambulatory Visit
Admission: RE | Admit: 2020-02-19 | Discharge: 2020-02-19 | Disposition: A | Discharge status: Home / Self Care | Payer: 59 | Source: Ambulatory Visit | Attending: Family Medicine | Admitting: Family Medicine

## 2020-02-19 DIAGNOSIS — Z1231 Encounter for screening mammogram for malignant neoplasm of breast: Secondary | ICD-10-CM

## 2020-08-25 ENCOUNTER — Encounter: Payer: Self-pay | Admitting: Family Medicine

## 2020-08-25 ENCOUNTER — Ambulatory Visit (INDEPENDENT_AMBULATORY_CARE_PROVIDER_SITE_OTHER): Payer: 59 | Admitting: Family Medicine

## 2020-08-25 ENCOUNTER — Other Ambulatory Visit: Payer: Self-pay

## 2020-08-25 VITALS — BP 110/66 | HR 72 | Temp 96.9°F | Ht 66.75 in | Wt 149.2 lb

## 2020-08-25 DIAGNOSIS — K219 Gastro-esophageal reflux disease without esophagitis: Secondary | ICD-10-CM

## 2020-08-25 DIAGNOSIS — F430 Acute stress reaction: Secondary | ICD-10-CM

## 2020-08-25 MED ORDER — PANTOPRAZOLE SODIUM 40 MG PO TBEC: 40.0000 mg | DELAYED_RELEASE_TABLET | Freq: Every day | ORAL | 3 refills | Status: DC

## 2020-08-25 NOTE — Progress Notes (Signed)
Subjective:    Patient ID: Meredith [name], female    DOB: 09/15/1960, 60 y.o.   MRN: [id number]  This visit occurred during the SARS-CoV-2 public health emergency.  Safety protocols were in place, including screening questions prior to the visit, additional usage of staff PPE, and extensive cleaning of exam room while observing appropriate contact time as indicated for disinfecting solutions.    HPI Pt presents for problems with GERD  Wt Readings from Last 3 Encounters:  08/25/20 149 lb 4 oz (67.7 kg)  01/01/20 162 lb 1 oz (73.5 kg)  06/01/19 145 lb (65.8 kg)  intentional wt loss-happy with that  23.55 kg/m  When she lost wt her GERD got better  Now creeping back   She has throat burning /all the time  tums helps a little   Does not matter what or when she eats  Red sauce may be worst  Even bothers her after drinking water  Sometimes pain in her epigastric area   Some acid in mouth on bad days  Clears throat  occ a little cough   Last EGD was 9/09  Had esophagitis and gastritis   No nsaids as a rule / rarely   Stress caring for husband with brain injury   Patient Active Problem List   Diagnosis Date Noted   Stress reaction 08/25/2020   Dyspareunia in female 01/01/2020   Colon cancer screening 01/01/2020   Elevated glucose level 01/01/2020   Migraine 05/30/2015   Vertigo 05/17/2013   Lumbar degenerative disc disease 03/08/2012   Other screening mammogram 01/12/2011   Gynecological examination 01/12/2011   Routine general medical examination at a health care facility 01/03/2011   Sciatica 11/20/2010   Neck pain 11/20/2010   PLANTAR FASCIITIS, BILATERAL 08/28/2009   HYPERCHOLESTEROLEMIA 10/13/2006   ALLERGIC RHINITIS 10/13/2006   GERD 10/13/2006   IBS 10/13/2006   MIGRAINES, HX OF 10/13/2006   Past Medical History:  Diagnosis Date   Acquired keratoderma    Acute sinusitis, unspecified    Allergic rhinitis    Cancer ([facility])     skin   Dysmenorrhea    Esophagitis    GERD (gastroesophageal reflux disease)    h/o  not much now   History of migraine headaches    HLD (hyperlipidemia)    IBS (irritable bowel syndrome)    Obesity    Pain in joint, lower leg    Plantar fascial fibromatosis    Pure hypercholesterolemia    Past Surgical History:  Procedure Laterality Date   COLONOSCOPY     SHOULDER ARTHROSCOPY WITH OPEN ROTATOR CUFF REPAIR Right 11/27/2015   Procedure: SHOULDER ARTHROSCOPY WITH OPEN ROTATOR CUFF REPAIR;  Surgeon: Thornton Park, MD;  Location: [facility] OR[facility];  Service: Orthopedics;  Laterality: Right;   treadmill stress test  01/13/11   Social History   Tobacco Use   Smoking status: Never Smoker   Smokeless tobacco: Never Used  Substance Use Topics   Alcohol use: Yes    Alcohol/week: 0.0 standard drinks    Comment: Occasional   Drug use: No   Family History  Problem Relation Age of Onset   Heart attack Mother    Hypertension Mother    Lung cancer Father        + smoker   GER disease Father        esophageal stricture   Other Sister 57       MAC disease   Hypertension Brother    No Known Allergies No  current outpatient medications on file prior to visit.   No current facility-administered medications on file prior to visit.    Review of Systems  Constitutional: Negative for activity change, appetite change, fatigue, fever and unexpected weight change.  HENT: Negative for congestion, ear pain, rhinorrhea, sinus pressure and sore throat.        Throat clearing frequent  Eyes: Negative for pain, redness and visual disturbance.  Respiratory: Negative for cough, shortness of breath and wheezing.   Cardiovascular: Negative for chest pain and palpitations.  Gastrointestinal: Negative for abdominal pain, blood in stool, constipation and diarrhea.       Acid reflux  Esophageal and throat symptoms   Endocrine: Negative for polydipsia and polyuria.  Genitourinary:  Negative for dysuria, frequency and urgency.  Musculoskeletal: Negative for arthralgias, back pain and myalgias.  Skin: Negative for pallor and rash.  Allergic/Immunologic: Negative for environmental allergies.  Neurological: Negative for dizziness, syncope and headaches.  Hematological: Negative for adenopathy. Does not bruise/bleed easily.  Psychiatric/Behavioral: Negative for decreased concentration and dysphoric mood. The patient is nervous/anxious.        High stress level       Objective:   Physical Exam Constitutional:      General: She is not in acute distress.    Appearance: Normal appearance. She is well-developed and normal weight. She is not ill-appearing or diaphoretic.  HENT:     Head: Normocephalic and atraumatic.  Eyes:     General: No scleral icterus.    Conjunctiva/sclera: Conjunctivae normal.     Pupils: Pupils are equal, round, and reactive to light.  Cardiovascular:     Rate and Rhythm: Normal rate and regular rhythm.     Heart sounds: Normal heart sounds.  Pulmonary:     Effort: Pulmonary effort is normal. No respiratory distress.     Breath sounds: Normal breath sounds. No wheezing or rales.  Abdominal:     General: Bowel sounds are normal. There is no distension.     Palpations: Abdomen is soft. There is no mass.     Tenderness: There is no abdominal tenderness. There is no guarding or rebound.  Musculoskeletal:     Cervical back: Normal range of motion and neck supple.  Lymphadenopathy:     Cervical: No cervical adenopathy.  Skin:    General: Skin is warm and dry.     Coloration: Skin is not jaundiced or pale.     Findings: No erythema or rash.  Neurological:     Mental Status: She is alert.     Motor: No weakness.  Psychiatric:        Mood and Affect: Mood normal.     Comments: Pleasant Freely discusses stressors and mood           Assessment & Plan:   Problem List Items Addressed This Visit      Digestive   GERD - Primary    Worse  lately (though intentional wt loss helped at first)  Disc dietary habits and avoidance of nsaids Px protonix 40 mg daily  Antacid prn  Update in 2 wk or earlier if needed  Consider GI ref if needed (esophagitis in the past)       Relevant Medications   pantoprazole (PROTONIX) 40 MG tablet     Other   Stress reaction    Caring for husband with brain injury (hard) Irritable/anxious Consider ssri once her GERD is in good control Reviewed stressors/ coping techniques/symptoms/ support sources/ tx options and  side effects in detail today Declines counseling ref for now

## 2020-08-25 NOTE — Patient Instructions (Addendum)
Start protonix 40 mg daily first thing in am at least 30 minutes before eating  Avoid foods that flare your symptoms   Let me know how you are doing  Take care of yourself   Once symptoms are controlled we may want to try a medication for anxiety  Let us know how you are doing in 2 weeks   Tylenol is the only over the counter pain to use  Topical voltaren gel is ok also

## 2020-08-25 NOTE — Assessment & Plan Note (Signed)
Worse lately (though intentional wt loss helped at first)  Disc dietary habits and avoidance of nsaids Px protonix 40 mg daily  Antacid prn  Update in 2 wk or earlier if needed  Consider GI ref if needed (esophagitis in the past)

## 2020-08-25 NOTE — Assessment & Plan Note (Signed)
Caring for husband with brain injury (hard) Irritable/anxious Consider ssri once her GERD is in good control Reviewed stressors/ coping techniques/symptoms/ support sources/ tx options and side effects in detail today Declines counseling ref for now

## 2020-10-06 ENCOUNTER — Ambulatory Visit (INDEPENDENT_AMBULATORY_CARE_PROVIDER_SITE_OTHER): Payer: 59 | Admitting: Podiatry

## 2020-10-06 ENCOUNTER — Ambulatory Visit (INDEPENDENT_AMBULATORY_CARE_PROVIDER_SITE_OTHER): Payer: 59

## 2020-10-06 ENCOUNTER — Encounter: Payer: Self-pay | Admitting: Podiatry

## 2020-10-06 ENCOUNTER — Other Ambulatory Visit: Payer: Self-pay

## 2020-10-06 DIAGNOSIS — M778 Other enthesopathies, not elsewhere classified: Secondary | ICD-10-CM | POA: Diagnosis not present

## 2020-10-06 NOTE — Progress Notes (Signed)
She presents today after having not seen her for a couple of years with a chief complaint of her's hallux starting to try to overlap the second toe left foot.  She states that is bothersome and she wears a splint at bedtime.  Objective: Vital signs are stable is alert oriented x3.  Pulses are palpable.  There is no erythema edema cellulitis drainage or odor.  Very nice Lapidus fusion of the first metatarsal medial cuneiform joint.  Nonpainful on attempted range of motion she has great range of motion of the first metatarsophalangeal joint with mild hallux valgus deformity.  Radiographs taken today demonstrate a great fusion and internal fixation is in good position at the TMT joint the lateral deviation at the first metatarsophalangeal joint is resulting in her symptomatology.  Assessment: Hallux valgus deformity.  Plan: We discussed the need for surgical intervention consisting of a first and second met osteotomies she understands and is amenable to it if necessary.  She is going to try the bracing and the toe spacers for a bit longer.

## 2020-11-11 ENCOUNTER — Ambulatory Visit (INDEPENDENT_AMBULATORY_CARE_PROVIDER_SITE_OTHER): Payer: 59 | Admitting: Family Medicine

## 2020-11-11 ENCOUNTER — Other Ambulatory Visit: Payer: Self-pay

## 2020-11-11 ENCOUNTER — Encounter: Payer: Self-pay | Admitting: Family Medicine

## 2020-11-11 VITALS — BP 110/62 | HR 69 | Temp 97.3°F | Resp 18 | Ht 68.0 in | Wt 150.8 lb

## 2020-11-11 DIAGNOSIS — H938X9 Other specified disorders of ear, unspecified ear: Secondary | ICD-10-CM | POA: Insufficient documentation

## 2020-11-11 DIAGNOSIS — H938X2 Other specified disorders of left ear: Secondary | ICD-10-CM

## 2020-11-11 DIAGNOSIS — H8113 Benign paroxysmal vertigo, bilateral: Secondary | ICD-10-CM | POA: Diagnosis not present

## 2020-11-11 DIAGNOSIS — J301 Allergic rhinitis due to pollen: Secondary | ICD-10-CM

## 2020-11-11 DIAGNOSIS — H8110 Benign paroxysmal vertigo, unspecified ear: Secondary | ICD-10-CM | POA: Insufficient documentation

## 2020-11-11 MED ORDER — MECLIZINE HCL 25 MG PO TABS: 25.0000 mg | ORAL_TABLET | Freq: Three times a day (TID) | ORAL | 1 refills | Status: DC | PRN

## 2020-11-11 MED ORDER — FLUTICASONE PROPIONATE 50 MCG/ACT NA SUSP: 2.0000 | Freq: Every day | NASAL | 6 refills | Status: DC

## 2020-11-11 NOTE — Progress Notes (Signed)
Subjective:    Patient ID: Meredith [name], female    DOB: February 01, 1961, 60 y.o.   MRN: [id number]  This visit occurred during the SARS-CoV-2 public health emergency.  Safety protocols were in place, including screening questions prior to the visit, additional usage of staff PPE, and extensive cleaning of exam room while observing appropriate contact time as indicated for disinfecting solutions.    HPI Pt presents with c/o dizziness   Wt Readings from Last 3 Encounters:  11/11/20 150 lb 12.8 oz (68.4 kg)  08/25/20 149 lb 4 oz (67.7 kg)  01/01/20 162 lb 1 oz (73.5 kg)   22.93 kg/m   Worse when lying down or rolling over or when sitting up from lying down Like a woosh  Thinks it is vertigo Spinning sensation  Makes her very nauseated  No falls or head injuries   Sitting still - at most very slightly light headed   Some pressure in L ear (deep) Used debrox 3 wk ago (hates the feeling of something in ear)   Just had procedure on L upper lip from dermatology  (noticed it when she sat up after that)   Per chart review Seen by Dr Darnell Level in 2014 for vertigo  A/P as follows:  Anticipate peripheral vertigo - ?vestibular neuritis. Treat with meclizine and rest and fluids.  Update if sxs worsening or not improving as expected, consider head imaging. Normal orthostatics.  nonfocal neurological exam today. No significant risk factors for CVA - bp normal today.art review has had vertigo before     BP Readings from Last 3 Encounters:  11/11/20 110/62  08/25/20 110/66  01/01/20 110/62   Pulse Readings from Last 3 Encounters:  11/11/20 69  08/25/20 72  01/01/20 68    Patient Active Problem List   Diagnosis Date Noted  . Benign positional vertigo 11/11/2020  . Ear pressure 11/11/2020  . Stress reaction 08/25/2020  . Dyspareunia in female 01/01/2020  . Colon cancer screening 01/01/2020  . Elevated glucose level 01/01/2020  . Migraine 05/30/2015  . Vertigo 05/17/2013  .  Lumbar degenerative disc disease 03/08/2012  . Other screening mammogram 01/12/2011  . Gynecological examination 01/12/2011  . Routine general medical examination at a health care facility 01/03/2011  . Sciatica 11/20/2010  . Neck pain 11/20/2010  . PLANTAR FASCIITIS, BILATERAL 08/28/2009  . HYPERCHOLESTEROLEMIA 10/13/2006  . Allergic rhinitis 10/13/2006  . GERD 10/13/2006  . IBS 10/13/2006  . MIGRAINES, HX OF 10/13/2006   Past Medical History:  Diagnosis Date  . Acquired keratoderma   . Acute sinusitis, unspecified   . Allergic rhinitis   . Cancer ([place])    skin  . Dysmenorrhea   . Esophagitis   . GERD (gastroesophageal reflux disease)    h/o  not much now  . History of migraine headaches   . HLD (hyperlipidemia)   . IBS (irritable bowel syndrome)   . Obesity   . Pain in joint, lower leg   . Plantar fascial fibromatosis   . Pure hypercholesterolemia    Past Surgical History:  Procedure Laterality Date  . COLONOSCOPY    . SHOULDER ARTHROSCOPY WITH OPEN ROTATOR CUFF REPAIR Right 11/27/2015   Procedure: SHOULDER ARTHROSCOPY WITH OPEN ROTATOR CUFF REPAIR;  Surgeon: Thornton Park, MD;  Location: [facility] ORS;  Service: Orthopedics;  Laterality: Right;  . treadmill stress test  01/13/11   Social History   Tobacco Use  . Smoking status: Never Smoker  . Smokeless tobacco: Never Used  Substance Use  Topics  . Alcohol use: Yes    Alcohol/week: 0.0 standard drinks    Comment: Occasional  . Drug use: No   Family History  Problem Relation Age of Onset  . Heart attack Mother   . Hypertension Mother   . Lung cancer Father        + smoker  . GER disease Father        esophageal stricture  . Other Sister 62       MAC disease  . Hypertension Brother    No Known Allergies No current outpatient medications on file prior to visit.   No current facility-administered medications on file prior to visit.    Review of Systems  Constitutional: Negative for activity change, appetite  change, fatigue, fever and unexpected weight change.  HENT: Positive for postnasal drip. Negative for congestion, ear discharge, ear pain, facial swelling, rhinorrhea, sinus pressure, sore throat and voice change.   Eyes: Negative for pain, redness and visual disturbance.  Respiratory: Negative for cough, shortness of breath and wheezing.   Cardiovascular: Negative for chest pain and palpitations.  Gastrointestinal: Negative for abdominal pain, blood in stool, constipation and diarrhea.  Endocrine: Negative for polydipsia and polyuria.  Genitourinary: Negative for dysuria, frequency and urgency.  Musculoskeletal: Negative for arthralgias, back pain and myalgias.  Skin: Negative for pallor and rash.  Allergic/Immunologic: Negative for environmental allergies.  Neurological: Positive for dizziness. Negative for tremors, syncope, facial asymmetry, speech difficulty, weakness, numbness and headaches.  Hematological: Negative for adenopathy. Does not bruise/bleed easily.  Psychiatric/Behavioral: Negative for decreased concentration and dysphoric mood. The patient is not nervous/anxious.        Objective:   Physical Exam Constitutional:      General: She is not in acute distress.    Appearance: Normal appearance. She is well-developed and normal weight. She is not ill-appearing, toxic-appearing or diaphoretic.  HENT:     Head: Normocephalic and atraumatic.     Right Ear: External ear normal.     Left Ear: External ear normal.     Nose: Nose normal.     Mouth/Throat:     Pharynx: No oropharyngeal exudate.  Eyes:     General: No scleral icterus.       Right eye: No discharge.        Left eye: No discharge.     Conjunctiva/sclera: Conjunctivae normal.     Pupils: Pupils are equal, round, and reactive to light.     Comments: 2-3 beats of horizontal nystagmus bilaterally  Neck:     Thyroid: No thyromegaly.     Vascular: No carotid bruit or JVD.     Trachea: No tracheal deviation.   Cardiovascular:     Rate and Rhythm: Normal rate and regular rhythm.     Heart sounds: Normal heart sounds. No murmur heard.   Pulmonary:     Effort: Pulmonary effort is normal. No respiratory distress.     Breath sounds: Normal breath sounds. No wheezing or rales.  Abdominal:     General: Bowel sounds are normal. There is no distension.     Palpations: Abdomen is soft. There is no mass.     Tenderness: There is no abdominal tenderness.  Musculoskeletal:        General: No tenderness.     Cervical back: Full passive range of motion without pain, normal range of motion and neck supple.     Right lower leg: No edema.     Left lower leg: No edema.  Lymphadenopathy:     Cervical: No cervical adenopathy.  Skin:    General: Skin is warm and dry.     Coloration: Skin is not pale.     Findings: No rash.  Neurological:     Mental Status: She is alert and oriented to person, place, and time.     Cranial Nerves: No cranial nerve deficit, dysarthria or facial asymmetry.     Sensory: Sensation is intact. No sensory deficit.     Motor: Motor function is intact. No weakness, tremor, atrophy, abnormal muscle tone or pronator drift.     Coordination: Coordination is intact. Romberg sign negative. Coordination normal. Finger-Nose-Finger Test normal.     Gait: Gait normal.     Deep Tendon Reflexes: Reflexes are normal and symmetric. Reflexes normal.     Comments: No focal cerebellar signs  Nl gait  Pt tries not to move her head quickly  Psychiatric:        Behavior: Behavior normal.        Thought Content: Thought content normal.        Cognition and Memory: Cognition and memory normal.           Assessment & Plan:   Problem List Items Addressed This Visit      Respiratory   Allergic rhinitis    Some recent L ear pressure and vertigo with rhinorrhea  Px flonase ns to use daily and update          Nervous and Auditory   Benign positional vertigo - Primary    Reassuring exam  with some nystagmus  Overall mild  Disc imp of slow position change Px meclizine with caution of sedation  Discussed the Epely maneuver and handout given for this and vertigo  Px flonase to help with any possible ETD and seasonal allergies  Update if not starting to improve in a week or if worsening           Other   Ear pressure    With vertigo Dull TMs on exam  tx with flonase and update

## 2020-11-11 NOTE — Assessment & Plan Note (Signed)
With vertigo Dull TMs on exam  tx with flonase and update

## 2020-11-11 NOTE — Patient Instructions (Signed)
Start the flonase nasal spray  Try meclizine with caution of sedation (night will be best) Change position slowly  Look at the info on the epely maneuver   Update if not starting to improve in a week or if worsening

## 2020-11-11 NOTE — Assessment & Plan Note (Signed)
Reassuring exam with some nystagmus  Overall mild  Disc imp of slow position change Px meclizine with caution of sedation  Discussed the Epely maneuver and handout given for this and vertigo  Px flonase to help with any possible ETD and seasonal allergies  Update if not starting to improve in a week or if worsening

## 2020-11-11 NOTE — Assessment & Plan Note (Signed)
Some recent L ear pressure and vertigo with rhinorrhea  Px flonase ns to use daily and update

## 2021-02-12 IMAGING — MG DIGITAL SCREENING BILAT W/ TOMO W/ CAD
8 series · 8 of 24 positions shown · non-contrast
Comparison: Previous exam(s).

CLINICAL DATA: Screening.

EXAM:
DIGITAL SCREENING BILATERAL MAMMOGRAM WITH TOMO AND CAD

[R MLO synth-2D]
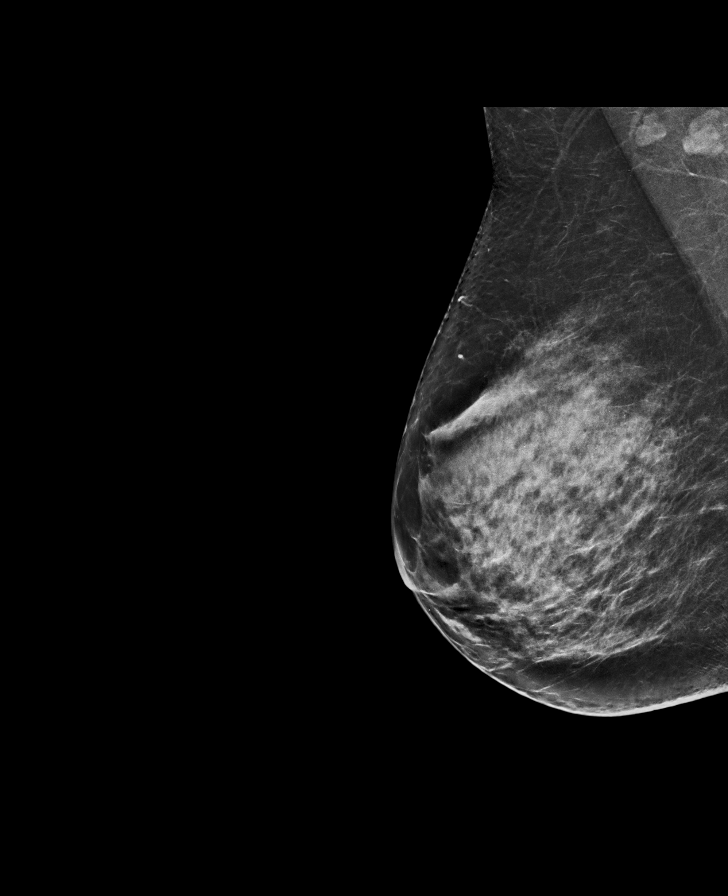

[L MLO synth-2D]
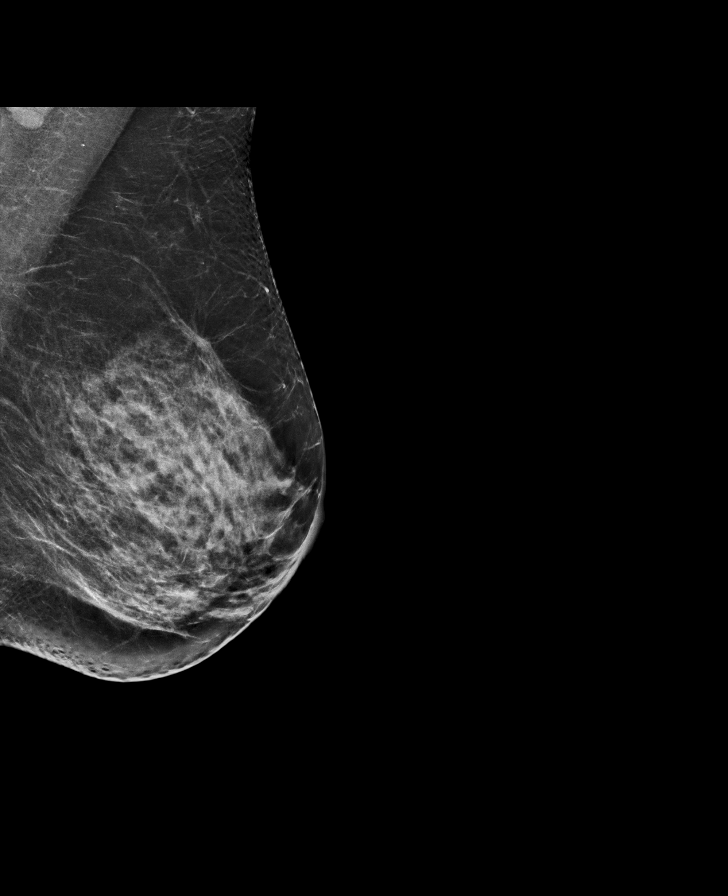

[L CC synth-2D]
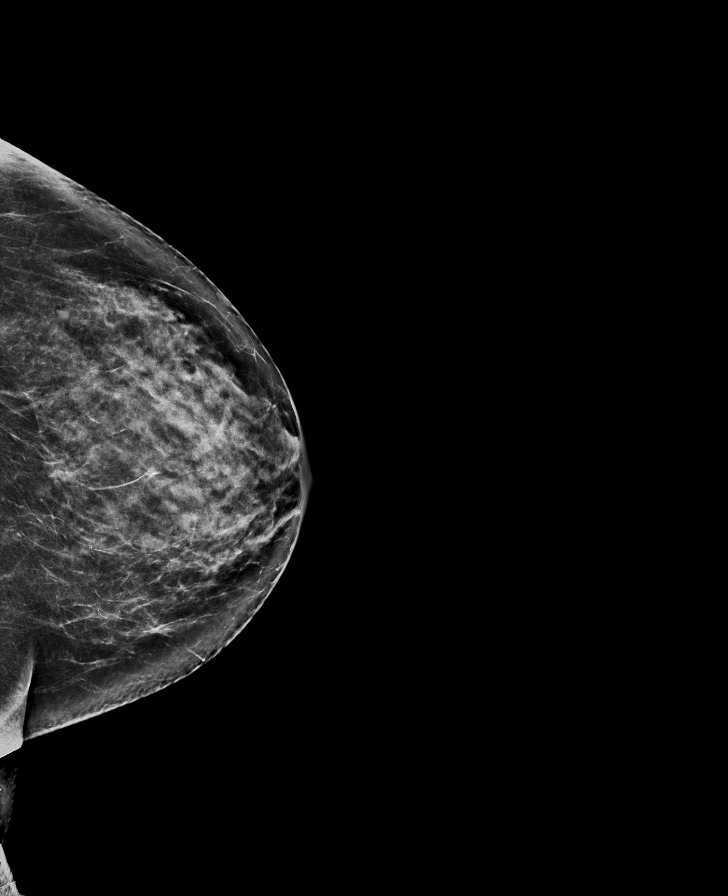

[R CC synth-2D]
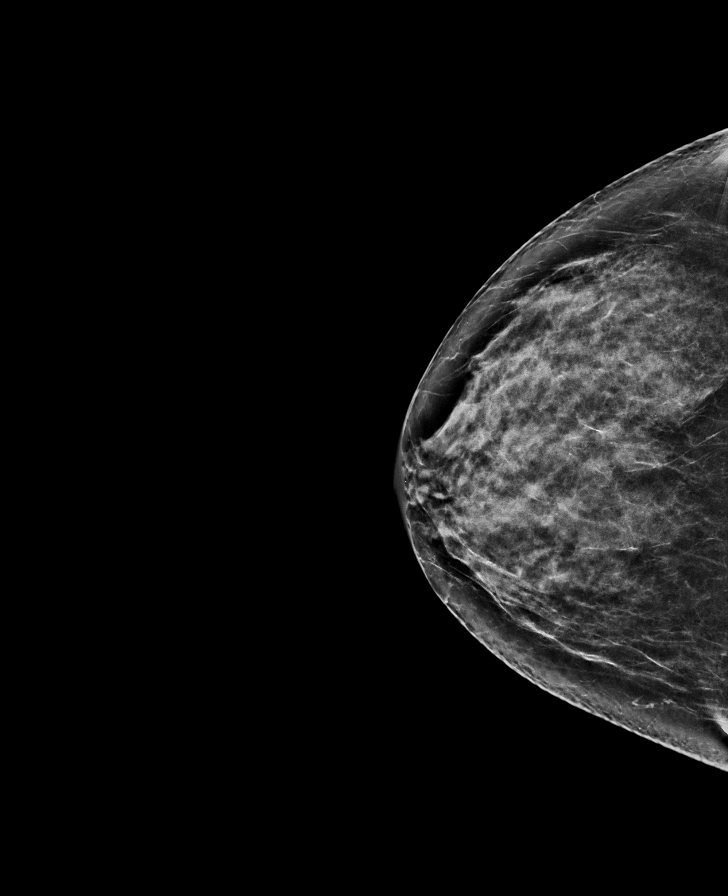

[L CC tomo · tomo slice 39/77.0]
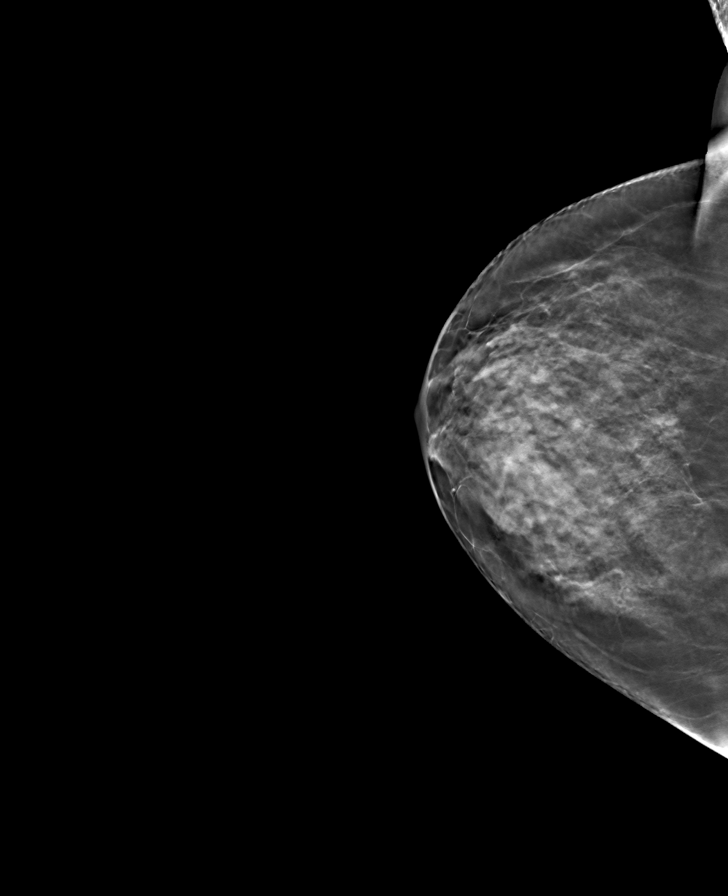

[R CC tomo · tomo slice 37/72.0]
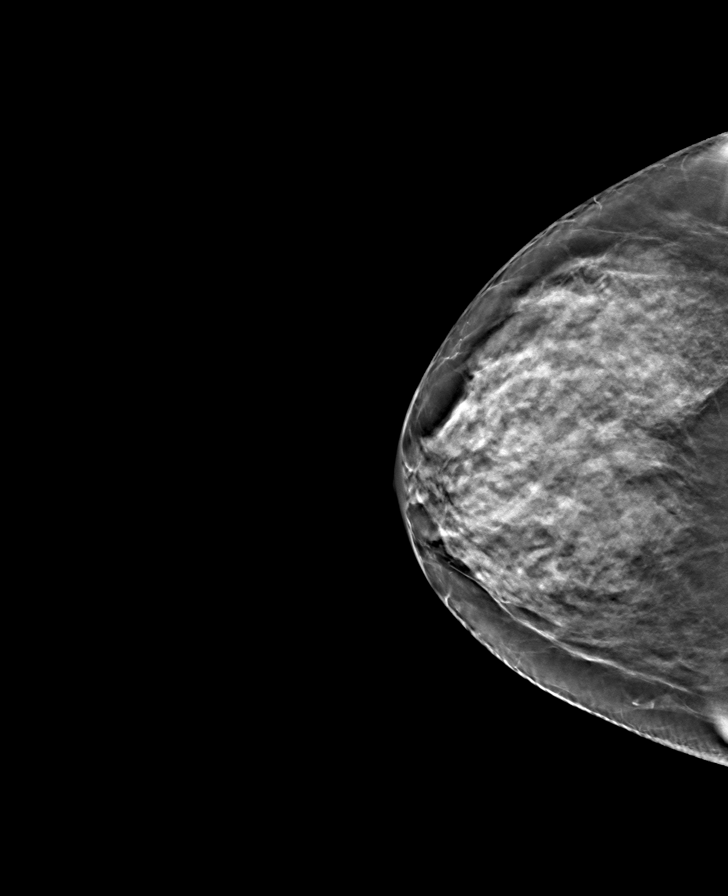

[R MLO tomo · tomo slice 41/80.0]
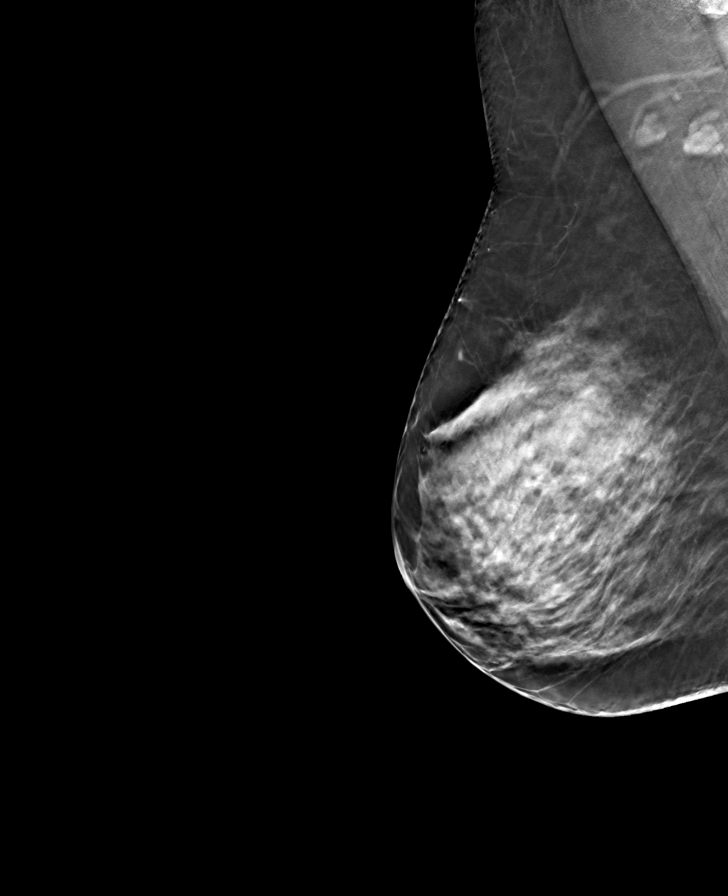

[L MLO tomo · tomo slice 39/77.0]
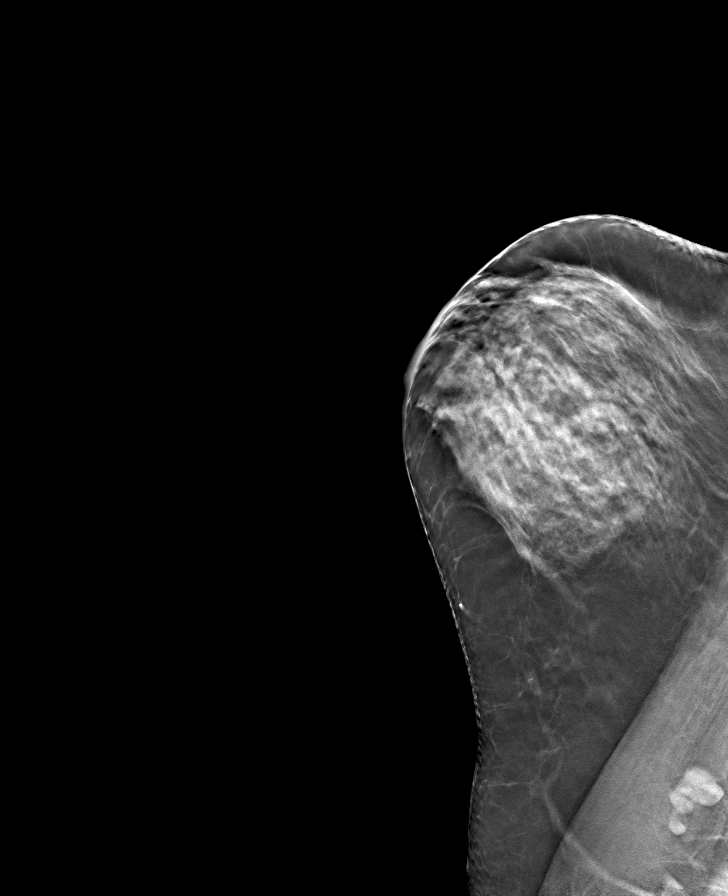

[8 of 24 positions shown; findings below may reference images not displayed]

ACR Breast Density Category c: The breast tissue is heterogeneously
dense, which may obscure small masses.
FINDINGS: There are no findings suspicious for malignancy. Images were
processed with CAD.
IMPRESSION: No mammographic evidence of malignancy. A result letter of this
screening mammogram will be mailed directly to the patient.

RECOMMENDATION:
Screening mammogram in one year. (Code:[id number])

BI-RADS CATEGORY  1: Negative.

## 2021-06-16 ENCOUNTER — Other Ambulatory Visit: Payer: Self-pay

## 2021-06-16 ENCOUNTER — Encounter: Payer: Self-pay | Admitting: Family Medicine

## 2021-06-16 ENCOUNTER — Ambulatory Visit (INDEPENDENT_AMBULATORY_CARE_PROVIDER_SITE_OTHER): Payer: 59 | Admitting: Family Medicine

## 2021-06-16 VITALS — BP 122/80 | HR 70 | Temp 98.0°F | Ht 67.0 in | Wt 163.1 lb

## 2021-06-16 DIAGNOSIS — Z0000 Encounter for general adult medical examination without abnormal findings: Secondary | ICD-10-CM

## 2021-06-16 DIAGNOSIS — R7309 Other abnormal glucose: Secondary | ICD-10-CM | POA: Diagnosis not present

## 2021-06-16 DIAGNOSIS — E7800 Pure hypercholesterolemia, unspecified: Secondary | ICD-10-CM

## 2021-06-16 DIAGNOSIS — Z1211 Encounter for screening for malignant neoplasm of colon: Secondary | ICD-10-CM

## 2021-06-16 DIAGNOSIS — Z1231 Encounter for screening mammogram for malignant neoplasm of breast: Secondary | ICD-10-CM | POA: Insufficient documentation

## 2021-06-16 LAB — COMPREHENSIVE METABOLIC PANEL
ALT: 11 U/L (ref 0–35)
AST: 16 U/L (ref 0–37)
Albumin: 4.3 g/dL (ref 3.5–5.2)
Alkaline Phosphatase: 63 U/L (ref 39–117)
BUN: 14 mg/dL (ref 6–23)
CO2: 27 mEq/L (ref 19–32)
Calcium: 8.8 mg/dL (ref 8.4–10.5)
Chloride: 106 mEq/L (ref 96–112)
Creatinine, Ser: 0.67 mg/dL (ref 0.40–1.20)
GFR: 95.16 mL/min (ref >60.00)
Glucose, Bld: 99 mg/dL (ref 70–99)
Potassium: 4.5 mEq/L (ref 3.5–5.1)
Sodium: 140 mEq/L (ref 135–145)
Total Bilirubin: 0.5 mg/dL (ref 0.2–1.2)
Total Protein: 6.8 g/dL (ref 6.0–8.3)

## 2021-06-16 LAB — LIPID PANEL
Cholesterol: 241 mg/dL — ABNORMAL HIGH (ref 0–200)
HDL: 76.8 mg/dL (ref >39.00)
LDL Cholesterol: 145 mg/dL — ABNORMAL HIGH (ref 0–99)
NonHDL: 164.49
Total CHOL/HDL Ratio: 3
Triglycerides: 97 mg/dL (ref 0.0–149.0)
VLDL: 19.4 mg/dL (ref 0.0–40.0)

## 2021-06-16 LAB — CBC WITH DIFFERENTIAL/PLATELET
Basophils Absolute: 0 10*3/uL (ref 0.0–0.1)
Basophils Relative: 0.7 % (ref 0.0–3.0)
Eosinophils Absolute: 0 10*3/uL (ref 0.0–0.7)
Eosinophils Relative: 0.9 % (ref 0.0–5.0)
HCT: 36.4 % (ref 36.0–46.0)
Hemoglobin: 11.9 g/dL — ABNORMAL LOW (ref 12.0–15.0)
Lymphocytes Relative: 47 % — ABNORMAL HIGH (ref 12.0–46.0)
Lymphs Abs: 2.1 10*3/uL (ref 0.7–4.0)
MCHC: 32.7 g/dL (ref 30.0–36.0)
MCV: 98.6 fl (ref 78.0–100.0)
Monocytes Absolute: 0.4 10*3/uL (ref 0.1–1.0)
Monocytes Relative: 8.4 % (ref 3.0–12.0)
Neutro Abs: 1.9 10*3/uL (ref 1.4–7.7)
Neutrophils Relative %: 43 % (ref 43.0–77.0)
Platelets: 234 10*3/uL (ref 150.0–400.0)
RBC: 3.69 Mil/uL — ABNORMAL LOW (ref 3.87–5.11)
RDW: 13.7 % (ref 11.5–15.5)
WBC: 4.5 10*3/uL (ref 4.0–10.5)

## 2021-06-16 LAB — HEMOGLOBIN A1C: Hgb A1c MFr Bld: 5.6 % (ref 4.6–6.5)

## 2021-06-16 LAB — TSH: TSH: 2.52 u[IU]/mL (ref 0.35–5.50)

## 2021-06-16 NOTE — Progress Notes (Signed)
Subjective:    Patient ID: Meredith [name], female    DOB: [birth year], 61 y.o.   MRN: [id number]  This visit occurred during the SARS-CoV-2 public health emergency.  Safety protocols were in place, including screening questions prior to the visit, additional usage of staff PPE, and extensive cleaning of exam room while observing appropriate contact time as indicated for disinfecting solutions.   HPI Here for health maintenance exam and to review chronic medical problems   Wt Readings from Last 3 Encounters:  06/16/21 163 lb 2 oz (74 kg)  11/11/20 150 lb 12.8 oz (68.4 kg)  08/25/20 149 lb 4 oz (67.7 kg)   25.55 kg/m  Been feeling good overall  Taking care of herself   Cares for husband who had a head injury  Declines flu shot Had 2 covid vaccines  Zoster status -declines  Td 2021  Mammogram 02/2020- due for that at [facility] breast exam-no lumps  Supplements -none No falls or broken bones   Colonoscopy 2009 Knows she is due  Hard to schedule because she will need transportation  Will do ifob kit   Utd derm care, basal cell lesions in the past  Brother has had melanoma  Pap 02/2020 normal with neg HPV at gyn Has not been to gyn since then   BP Readings from Last 3 Encounters:  06/16/21 122/80  11/11/20 110/62  08/25/20 110/66   Pulse Readings from Last 3 Encounters:  06/16/21 70  11/11/20 69  08/25/20 72     Hyperlipidemia Lab Results  Component Value Date   CHOL 209 (H) 12/27/2019   HDL 68.70 12/27/2019   LDLCALC 121 (H) 12/27/2019   LDLDIRECT 140.4 03/01/2012   TRIG 95.0 12/27/2019   CHOLHDL 3 12/27/2019   Elevated glucose in the past  Diet is pretty good  Eating more than she used to /more hungry  Exercise-quite a bit  Mainly outdoor stuff (chainsaw)  Belongs to gym also -will meet daughter there/good plan    Patient Active Problem List   Diagnosis Date Noted   Encounter for screening mammogram for breast cancer 06/16/2021   Benign  positional vertigo 11/11/2020   Stress reaction 08/25/2020   Colon cancer screening 01/01/2020   Elevated glucose level 01/01/2020   Vertigo 05/17/2013   Lumbar degenerative disc disease 03/08/2012   Other screening mammogram 01/12/2011   Gynecological examination 01/12/2011   Routine general medical examination at a health care facility 01/03/2011   PLANTAR FASCIITIS, BILATERAL 08/28/2009   HYPERCHOLESTEROLEMIA 10/13/2006   Allergic rhinitis 10/13/2006   GERD 10/13/2006   IBS 10/13/2006   MIGRAINES, HX OF 10/13/2006   Past Medical History:  Diagnosis Date   Acquired keratoderma    Acute sinusitis, unspecified    Allergic rhinitis    Cancer ([facility])    skin   Dysmenorrhea    Esophagitis    GERD (gastroesophageal reflux disease)    h/o  not much now   History of migraine headaches    HLD (hyperlipidemia)    IBS (irritable bowel syndrome)    Obesity    Pain in joint, lower leg    Plantar fascial fibromatosis    Pure hypercholesterolemia    Past Surgical History:  Procedure Laterality Date   COLONOSCOPY     SHOULDER ARTHROSCOPY WITH OPEN ROTATOR CUFF REPAIR Right 11/27/2015   Procedure: SHOULDER ARTHROSCOPY WITH OPEN ROTATOR CUFF REPAIR;  Surgeon: Thornton Park, MD;  Location: [facility] ORS;  Service: Orthopedics;  Laterality: Right;  treadmill stress test  01/13/11   Social History   Tobacco Use   Smoking status: Never   Smokeless tobacco: Never  Substance Use Topics   Alcohol use: Yes    Alcohol/week: 0.0 standard drinks    Comment: Occasional   Drug use: No   Family History  Problem Relation Age of Onset   Heart attack Mother    Hypertension Mother    Lung cancer Father        + smoker   GER disease Father        esophageal stricture   Other Sister 53       MAC disease   Hypertension Brother    No Known Allergies No current outpatient medications on file prior to visit.   No current facility-administered medications on file prior to visit.     Review of  Systems  Constitutional:  Negative for activity change, appetite change, fatigue, fever and unexpected weight change.  HENT:  Negative for congestion, ear pain, rhinorrhea, sinus pressure and sore throat.   Eyes:  Negative for pain, redness and visual disturbance.  Respiratory:  Negative for cough, shortness of breath and wheezing.   Cardiovascular:  Negative for chest pain and palpitations.  Gastrointestinal:  Negative for abdominal pain, blood in stool, constipation and diarrhea.  Endocrine: Negative for polydipsia and polyuria.  Genitourinary:  Negative for dysuria, frequency and urgency.  Musculoskeletal:  Negative for arthralgias, back pain and myalgias.  Skin:  Negative for pallor and rash.  Allergic/Immunologic: Negative for environmental allergies.  Neurological:  Negative for dizziness, syncope and headaches.  Hematological:  Negative for adenopathy. Does not bruise/bleed easily.  Psychiatric/Behavioral:  Negative for decreased concentration and dysphoric mood. The patient is not nervous/anxious.       Objective:   Physical Exam Constitutional:      General: She is not in acute distress.    Appearance: Normal appearance. She is well-developed and normal weight. She is not ill-appearing or diaphoretic.  HENT:     Head: Normocephalic and atraumatic.     Right Ear: Tympanic membrane, ear canal and external ear normal.     Left Ear: Tympanic membrane, ear canal and external ear normal.     Nose: Nose normal. No congestion.     Mouth/Throat:     Mouth: Mucous membranes are moist.     Pharynx: Oropharynx is clear. No posterior oropharyngeal erythema.  Eyes:     General: No scleral icterus.    Extraocular Movements: Extraocular movements intact.     Conjunctiva/sclera: Conjunctivae normal.     Pupils: Pupils are equal, round, and reactive to light.  Neck:     Thyroid: No thyromegaly.     Vascular: No carotid bruit or JVD.  Cardiovascular:     Rate and Rhythm: Normal rate and  regular rhythm.     Pulses: Normal pulses.     Heart sounds: Normal heart sounds.    No gallop.  Pulmonary:     Effort: Pulmonary effort is normal. No respiratory distress.     Breath sounds: Normal breath sounds. No wheezing.     Comments: Good air exch Chest:     Chest wall: No tenderness.  Abdominal:     General: Bowel sounds are normal. There is no distension or abdominal bruit.     Palpations: Abdomen is soft. There is no mass.     Tenderness: There is no abdominal tenderness.     Hernia: No hernia is present.  Genitourinary:  Comments: Breast exam: No mass, nodules, thickening, tenderness, bulging, retraction, inflamation, nipple discharge or skin changes noted.  No axillary or clavicular LA.     Musculoskeletal:        General: No tenderness. Normal range of motion.     Cervical back: Normal range of motion and neck supple. No rigidity. No muscular tenderness.     Right lower leg: No edema.     Left lower leg: No edema.     Comments: No kyphosis   Lymphadenopathy:     Cervical: No cervical adenopathy.  Skin:    General: Skin is warm and dry.     Coloration: Skin is not pale.     Findings: No erythema or rash.     Comments: Solar lentigines diffusely   Neurological:     Mental Status: She is alert. Mental status is at baseline.     Cranial Nerves: No cranial nerve deficit.     Motor: No abnormal muscle tone.     Coordination: Coordination normal.     Gait: Gait normal.     Deep Tendon Reflexes: Reflexes are normal and symmetric. Reflexes normal.  Psychiatric:        Mood and Affect: Mood normal.        Cognition and Memory: Cognition and memory normal.          Assessment & Plan:   Problem List Items Addressed This Visit       Other   HYPERCHOLESTEROLEMIA    Disc goals for lipids and reasons to control them Rev last labs with pt Rev low sat fat diet in detail Per pt =eating more now but still watches Lipid panel pending      Relevant Orders    Lipid panel   Routine general medical examination at a health care facility - Primary    Reviewed health habits including diet and exercise and skin cancer prevention Reviewed appropriate screening tests for age  Also reviewed health mt list, fam hx and immunization status , as well as social and family history   See HPI Labs ordered  ifob kit ordered for colon cancer screening/ until she can get transportation for a colonoscopy  utd derm visits (h/o basal cell)  Pap utd Mammogram ordered for [place]  Declines flu shot and shingles vaccine  Good exercise  Disc ca and and D supplement for bone health      Relevant Orders   CBC with Differential/Platelet   Comprehensive metabolic panel   Lipid panel   TSH   Colon cancer screening    Pt is not ready to do a colonoscopy yet ifob kit ordered      Relevant Orders   Fecal occult blood, imunochemical([facility])   Elevated glucose level    Diet is fair A1c ordered disc imp of low glycemic diet and wt loss to prevent DM2       Relevant Orders   Hemoglobin A1c   Encounter for screening mammogram for breast cancer    Nl breast exam  Mammogram ordered at [facility]      Relevant Orders   MM 3D SCREEN BREAST BILATERAL

## 2021-06-16 NOTE — Assessment & Plan Note (Signed)
Pt is not ready to do a colonoscopy yet ifob kit ordered

## 2021-06-16 NOTE — Assessment & Plan Note (Signed)
Disc goals for lipids and reasons to control them Rev last labs with pt Rev low sat fat diet in detail Per pt =eating more now but still watches Lipid panel pending

## 2021-06-16 NOTE — Assessment & Plan Note (Signed)
Reviewed health habits including diet and exercise and skin cancer prevention Reviewed appropriate screening tests for age  Also reviewed health mt list, fam hx and immunization status , as well as social and family history   See HPI Labs ordered  ifob kit ordered for colon cancer screening/ until she can get transportation for a colonoscopy  utd derm visits (h/o basal cell)  Pap utd Mammogram ordered for [place]  Declines flu shot and shingles vaccine  Good exercise  Disc ca and and D supplement for bone health

## 2021-06-16 NOTE — Assessment & Plan Note (Signed)
Diet is fair A1c ordered disc imp of low glycemic diet and wt loss to prevent DM2

## 2021-06-16 NOTE — Patient Instructions (Addendum)
Try to get 1200-1500 mg of calcium per day with at least 1000 iu of vitamin D - for bone health  If you get constipated with calcium, then just take the vitamin D  Please do stool kit ifob for colon cancer screening  Let us know when you are ready for a colonoscopy  Labs today    To schedule mammogram  Please call the location of your choice from the menu below to schedule your Mammogram and/or Bone Density appointment.    [facility]                      Phone:  [phone], [place] [address]                                                             Services: Traditional and 3D Mammogram, [facility]                 Phone: [phone] [address], [place] [address]    Service: Bone Density ONLY   *this site does NOT perform mammograms  [facility]                        Phone:  [phone] [address], [place] [address]                                            Services:  3D Mammogram and [place] [facility]   Phone:  [phone]   [place], [place] [address]                                            Services: 3D Mammogram and Bone  Density  [place] [facility])  Phone:  [phone]   [address] [place], [place] [address]                                              Services:  3D Mammogram and Bone Density

## 2021-06-16 NOTE — Assessment & Plan Note (Signed)
Nl breast exam  Mammogram ordered at [facility]

## 2021-06-20 ENCOUNTER — Encounter: Payer: Self-pay | Admitting: Family Medicine

## 2021-06-22 DIAGNOSIS — C44319 Basal cell carcinoma of skin of other parts of face: Secondary | ICD-10-CM | POA: Diagnosis not present

## 2021-06-22 DIAGNOSIS — L821 Other seborrheic keratosis: Secondary | ICD-10-CM | POA: Diagnosis not present

## 2021-06-22 DIAGNOSIS — C44519 Basal cell carcinoma of skin of other part of trunk: Secondary | ICD-10-CM | POA: Diagnosis not present

## 2021-06-22 DIAGNOSIS — D2272 Melanocytic nevi of left lower limb, including hip: Secondary | ICD-10-CM | POA: Diagnosis not present

## 2021-06-22 DIAGNOSIS — Z85828 Personal history of other malignant neoplasm of skin: Secondary | ICD-10-CM | POA: Diagnosis not present

## 2021-06-24 ENCOUNTER — Ambulatory Visit
Admission: RE | Admit: 2021-06-24 | Discharge: 2021-06-24 | Disposition: A | Discharge status: Home / Self Care | Payer: 59 | Source: Ambulatory Visit | Attending: Family Medicine | Admitting: Family Medicine

## 2021-06-24 ENCOUNTER — Other Ambulatory Visit: Payer: Self-pay

## 2021-06-24 DIAGNOSIS — Z1231 Encounter for screening mammogram for malignant neoplasm of breast: Secondary | ICD-10-CM | POA: Diagnosis not present

## 2021-06-25 ENCOUNTER — Other Ambulatory Visit (INDEPENDENT_AMBULATORY_CARE_PROVIDER_SITE_OTHER): Payer: 59

## 2021-06-25 ENCOUNTER — Telehealth: Payer: Self-pay | Admitting: *Deleted

## 2021-06-25 DIAGNOSIS — Z1211 Encounter for screening for malignant neoplasm of colon: Secondary | ICD-10-CM

## 2021-06-25 DIAGNOSIS — D649 Anemia, unspecified: Secondary | ICD-10-CM | POA: Insufficient documentation

## 2021-06-25 LAB — FECAL OCCULT BLOOD, IMMUNOCHEMICAL: Fecal Occult Bld: NEGATIVE

## 2021-06-25 NOTE — Telephone Encounter (Signed)
Please place future orders for lab appt.  

## 2021-06-26 ENCOUNTER — Other Ambulatory Visit: Payer: Self-pay

## 2021-06-26 ENCOUNTER — Other Ambulatory Visit (INDEPENDENT_AMBULATORY_CARE_PROVIDER_SITE_OTHER): Payer: Self-pay

## 2021-06-26 DIAGNOSIS — D649 Anemia, unspecified: Secondary | ICD-10-CM | POA: Diagnosis not present

## 2021-06-26 NOTE — Addendum Note (Signed)
Addended by: Leeanne Rio on: 06/26/2021 11:54 AM   Modules accepted: Orders

## 2021-06-29 LAB — CBC WITH DIFFERENTIAL/PLATELET
Absolute Monocytes: 484 cells/uL (ref 200–950)
Basophils Absolute: 41 cells/uL (ref 0–200)
Basophils Relative: 0.7 %
Eosinophils Absolute: 18 cells/uL (ref 15–500)
Eosinophils Relative: 0.3 %
HCT: 36.8 % (ref 35.0–45.0)
Hemoglobin: 12.5 g/dL (ref 11.7–15.5)
Lymphs Abs: 2879 cells/uL (ref 850–3900)
MCH: 32.6 pg (ref 27.0–33.0)
MCHC: 34 g/dL (ref 32.0–36.0)
MCV: 95.8 fL (ref 80.0–100.0)
MPV: 9.9 fL (ref 7.5–12.5)
Monocytes Relative: 8.2 %
Neutro Abs: 2478 cells/uL (ref 1500–7800)
Neutrophils Relative %: 42 %
Platelets: 260 10*3/uL (ref 140–400)
RBC: 3.84 10*6/uL (ref 3.80–5.10)
RDW: 12.3 % (ref 11.0–15.0)
Total Lymphocyte: 48.8 %
WBC: 5.9 10*3/uL (ref 3.8–10.8)

## 2021-06-29 LAB — IRON: Iron: 86 ug/dL (ref 45–160)

## 2021-06-29 LAB — PATHOLOGIST SMEAR REVIEW

## 2021-06-29 LAB — FERRITIN: Ferritin: 95 ng/mL (ref 16–232)

## 2021-10-02 ENCOUNTER — Encounter: Payer: Self-pay | Admitting: Nurse Practitioner

## 2021-10-02 ENCOUNTER — Ambulatory Visit (INDEPENDENT_AMBULATORY_CARE_PROVIDER_SITE_OTHER): Payer: 59 | Admitting: Nurse Practitioner

## 2021-10-02 ENCOUNTER — Ambulatory Visit (INDEPENDENT_AMBULATORY_CARE_PROVIDER_SITE_OTHER)
Admission: RE | Admit: 2021-10-02 | Discharge: 2021-10-02 | Disposition: A | Discharge status: Home / Self Care | Payer: 59 | Source: Ambulatory Visit | Attending: Nurse Practitioner | Admitting: Nurse Practitioner

## 2021-10-02 VITALS — BP 128/76 | HR 91 | Temp 96.6°F | Resp 14 | Wt 157.3 lb

## 2021-10-02 DIAGNOSIS — R0789 Other chest pain: Secondary | ICD-10-CM

## 2021-10-02 DIAGNOSIS — R0602 Shortness of breath: Secondary | ICD-10-CM | POA: Diagnosis not present

## 2021-10-02 DIAGNOSIS — R42 Dizziness and giddiness: Secondary | ICD-10-CM

## 2021-10-02 DIAGNOSIS — R051 Acute cough: Secondary | ICD-10-CM

## 2021-10-02 DIAGNOSIS — H6121 Impacted cerumen, right ear: Secondary | ICD-10-CM

## 2021-10-02 DIAGNOSIS — R059 Cough, unspecified: Secondary | ICD-10-CM | POA: Diagnosis not present

## 2021-10-02 DIAGNOSIS — R079 Chest pain, unspecified: Secondary | ICD-10-CM | POA: Diagnosis not present

## 2021-10-02 LAB — POC COVID19 BINAXNOW: SARS Coronavirus 2 Ag: NEGATIVE

## 2021-10-02 LAB — CBC
HCT: 35.2 % — ABNORMAL LOW (ref 36.0–46.0)
Hemoglobin: 11.6 g/dL — ABNORMAL LOW (ref 12.0–15.0)
MCHC: 32.9 g/dL (ref 30.0–36.0)
MCV: 98.9 fl (ref 78.0–100.0)
Platelets: 255 10*3/uL (ref 150.0–400.0)
RBC: 3.56 Mil/uL — ABNORMAL LOW (ref 3.87–5.11)
RDW: 13.8 % (ref 11.5–15.5)
WBC: 10.5 10*3/uL (ref 4.0–10.5)

## 2021-10-02 LAB — COMPREHENSIVE METABOLIC PANEL
ALT: 25 U/L (ref 0–35)
AST: 43 U/L — ABNORMAL HIGH (ref 0–37)
Albumin: 4.4 g/dL (ref 3.5–5.2)
Alkaline Phosphatase: 105 U/L (ref 39–117)
BUN: 11 mg/dL (ref 6–23)
CO2: 27 mEq/L (ref 19–32)
Calcium: 8.8 mg/dL (ref 8.4–10.5)
Chloride: 104 mEq/L (ref 96–112)
Creatinine, Ser: 0.78 mg/dL (ref 0.40–1.20)
GFR: 82.52 mL/min (ref >60.00)
Glucose, Bld: 102 mg/dL — ABNORMAL HIGH (ref 70–99)
Potassium: 4.1 mEq/L (ref 3.5–5.1)
Sodium: 141 mEq/L (ref 135–145)
Total Bilirubin: 0.5 mg/dL (ref 0.2–1.2)
Total Protein: 7.2 g/dL (ref 6.0–8.3)

## 2021-10-02 NOTE — Assessment & Plan Note (Signed)
Patient having atypical chest pain.  EKG within normal limits compared to EKG done approximate 10 years ago.  Did give strict signs and symptoms of symptoms change in nature or become concerning to go to the nearest emergency department.  Patient acknowledged did recommend trying some Tylenol to help with chest discomfort as she is already tried NSAIDs ?

## 2021-10-02 NOTE — Assessment & Plan Note (Signed)
Atypical presentation of shortness of breath.  Patient has no history of cardiac event nor DVT or PE.  Will obtain chest x-ray, lab studies.  EKG was within normal limits.  Follow-up if no improvement emergency department precautions reviewed ?

## 2021-10-02 NOTE — Progress Notes (Signed)
? ?Acute Office Visit ? ?Subjective:  ? ?  ?Patient ID: Meredith [name], female    DOB: 11-26-1960, 61 y.o.   MRN: [id number] ? ?Chief Complaint  ?Patient presents with  ? Cough  ?  Started about 4 days ago- Slight dry cough, chest pain-almost all the time and into her back,some runny nose, headache off and on. Has taking Mucinex DM, allergy medication OTC.  ? ? ? ?Patient is in today for cough ?Symptoms started approx 4 days ago ?No sick contact ?Pfizer vaccine x2 ?No covid test ?No flu vaccine this season ?Has been using mucinex dm ? ?Chest pain : states that it started 2 days ago.  Started in the middle of the day when she was out to lunch. States thought it was indigestion. Did not go away states that Wednesday night it was there and has increased some. States that she had lost weight and has not had trouble from the GERD, does not feel that this feels like her traditional GERD symptoms.  No history of cardiac events per patient.  States her mother did die early of a cardiovascular event.  Patient has had stress test in the past that came back negative. ? ?States it feels tight in the center to the right and in between the shoulder blades. States that she cannot make it better or worse. States a hot bath helps.. Elevating head does not help. Deep breaths make it feel worse. No history of DVT or blood clot. ? ?Vertigo: states she normally gets with allergies for approx 1 month. She was taking meclizine without relief. She stated taking otc allergy medicatoin.  Since resolved. ? ?Review of Systems  ?Constitutional:  Negative for chills and fever.  ?HENT:  Negative for ear discharge, ear pain (full on right side), sinus pain and sore throat.   ?Respiratory:  Positive for cough. Negative for shortness of breath.   ?     Abnormal to breath ?  ?Cardiovascular:  Positive for chest pain. Negative for palpitations and leg swelling.  ?Gastrointestinal:  Positive for vomiting (dry heave). Negative for abdominal pain  and diarrhea.  ?Musculoskeletal:  Negative for myalgias.  ?Neurological:  Positive for headaches. Negative for dizziness.  ? ? ?   ?Objective:  ?  ?BP 128/76   Pulse 91   Temp (!) 96.6 ?F (35.9 ?C)   Resp 14   Wt 157 lb 5 oz (71.4 kg)   LMP 06/15/2015 (Approximate)   SpO2 97%   BMI 24.64 kg/m?  ? ? ?Physical Exam ?Constitutional:   ?   Appearance: Normal appearance.  ?HENT:  ?   Right Ear: Ear canal and external ear normal. There is impacted cerumen.  ?   Left Ear: Tympanic membrane, ear canal and external ear normal. There is no impacted cerumen.  ?   Mouth/Throat:  ?   Mouth: Mucous membranes are moist.  ?   Pharynx: Oropharynx is clear.  ?Eyes:  ?   Extraocular Movements: Extraocular movements intact.  ?   Pupils: Pupils are equal, round, and reactive to light.  ?Cardiovascular:  ?   Rate and Rhythm: Normal rate and regular rhythm.  ?   Heart sounds: Normal heart sounds.  ?Pulmonary:  ?   Effort: Pulmonary effort is normal.  ?   Breath sounds: Normal breath sounds.  ?Chest:  ?   Chest wall: No tenderness.  ?Abdominal:  ?   General: Bowel sounds are normal.  ?Lymphadenopathy:  ?   Cervical: No cervical  adenopathy.  ?Skin: ?   General: Skin is warm.  ?Neurological:  ?   Mental Status: She is alert.  ? ? ?No results found for any visits on 10/02/21. ? ? ?   ?Assessment & Plan:  ? ?Problem List Items Addressed This Visit   ? ?  ? Nervous and Auditory  ? Impacted cerumen of right ear  ?  Verbal consent obtained.  Patient was prepared and mixture used of water and hydrogen peroxide per office policy.  Patient tolerated procedure well ear irrigated ? ?  ?  ?  ? Other  ? Other chest pain - Primary  ?  Patient having atypical chest pain.  EKG within normal limits compared to EKG done approximate 10 years ago.  Did give strict signs and symptoms of symptoms change in nature or become concerning to go to the nearest emergency department.  Patient acknowledged did recommend trying some Tylenol to help with chest  discomfort as she is already tried NSAIDs ? ?  ?  ? Relevant Orders  ? EKG 12-Lead (Completed)  ? CBC  ? Comprehensive metabolic panel  ? D-dimer, quantitative  ? Shortness of breath  ?  Atypical presentation of shortness of breath.  Patient has no history of cardiac event nor DVT or PE.  Will obtain chest x-ray, lab studies.  EKG was within normal limits.  Follow-up if no improvement emergency department precautions reviewed ? ?  ?  ? Relevant Orders  ? D-dimer, quantitative  ? DG Chest 2 View  ? POC COVID-19 (Completed)  ? Vertigo  ?  History of the same has since resolved since patient started taking over-the-counter antihistamine. ? ?  ?  ? Acute cough  ?  Likely related to the upper respiratory infection.  Patient's COVID test was negative in office.  Pending lab work and chest x-ray.  Symptomatic treatment for now ? ?  ?  ? Relevant Orders  ? DG Chest 2 View  ? POC COVID-19 (Completed)  ? ? ?No orders of the defined types were placed in this encounter. ? ? ?No follow-ups on file. ? ?Romilda Garret, NP ? ? ?

## 2021-10-02 NOTE — Assessment & Plan Note (Signed)
Likely related to the upper respiratory infection.  Patient's COVID test was negative in office.  Pending lab work and chest x-ray.  Symptomatic treatment for now ?

## 2021-10-02 NOTE — Assessment & Plan Note (Signed)
History of the same has since resolved since patient started taking over-the-counter antihistamine. ?

## 2021-10-02 NOTE — Patient Instructions (Signed)
Nice to see you today ?I will be in touch in regards with the results once I have them ?Follow up if no improvement. If the chest discomfort changes, increases go to the emergency department ?EKG looked good in office ? ?Continue the antihistamine. Try taking some tylenol to see if this will help with the chest discomfort.  ?

## 2021-10-02 NOTE — Assessment & Plan Note (Signed)
Verbal consent obtained.  Patient was prepared and mixture used of water and hydrogen peroxide per office policy.  Patient tolerated procedure well ear irrigated ?

## 2021-10-03 LAB — D-DIMER, QUANTITATIVE: D-Dimer, Quant: 0.4 mcg/mL FEU (ref <0.50)

## 2021-10-06 ENCOUNTER — Telehealth: Payer: Self-pay | Admitting: Nurse Practitioner

## 2021-10-06 NOTE — Telephone Encounter (Signed)
-----   Message from [place] sent at 10/05/2021  3:32 PM EDT ----- ?Patient advised. Patient states her chest feels some better about 40%, she does still has heaviness/tightness of the chest. Patient states she will proceed with CT scan, she has a different insurance so she will call them to check on the cheaper location for this and will call us back. ? ?Patient states she did notices some blood on the toilet paper and in the toilet water about 4 to 5 days ago. Not tarry stools, no blood clots. She thought maybe it was a hemorrhoid and was going to just use some OTC preparation H. Her rectum just feels a little sore with wiping. No more blood besides that one time. Bowels have been normal for her. Some fatigue but states she is very active in her garden and such.  ?

## 2021-10-06 NOTE — Telephone Encounter (Signed)
She can try preparation H and see if that helps. We did not discuss that in the office visit so I cannot speak clinically to it. If she continues to have trouble she will need to be seen. Let me know once we have heard from her and I will order the CT scan ?

## 2021-10-06 NOTE — Telephone Encounter (Signed)
Spoke with pt gave her this recommendation and she said that she hasn't follow up with insurance yet, that when she call she pt on hold and couldn't wait. She said once she does talk with them she will give [place] a call. ?

## 2021-10-07 ENCOUNTER — Encounter: Payer: Self-pay | Admitting: Nurse Practitioner

## 2021-10-07 DIAGNOSIS — R911 Solitary pulmonary nodule: Secondary | ICD-10-CM

## 2021-10-14 ENCOUNTER — Ambulatory Visit
Admission: RE | Admit: 2021-10-14 | Discharge: 2021-10-14 | Disposition: A | Discharge status: Home / Self Care | Payer: 59 | Source: Ambulatory Visit | Attending: Nurse Practitioner | Admitting: Nurse Practitioner

## 2021-10-14 DIAGNOSIS — R911 Solitary pulmonary nodule: Secondary | ICD-10-CM

## 2021-10-14 DIAGNOSIS — I77810 Thoracic aortic ectasia: Secondary | ICD-10-CM | POA: Diagnosis not present

## 2021-10-14 DIAGNOSIS — J984 Other disorders of lung: Secondary | ICD-10-CM | POA: Diagnosis not present

## 2021-10-14 DIAGNOSIS — R918 Other nonspecific abnormal finding of lung field: Secondary | ICD-10-CM | POA: Diagnosis not present

## 2021-10-15 ENCOUNTER — Other Ambulatory Visit: Payer: Self-pay | Admitting: Nurse Practitioner

## 2021-10-15 DIAGNOSIS — J189 Pneumonia, unspecified organism: Secondary | ICD-10-CM

## 2021-10-15 MED ORDER — AMOXICILLIN-POT CLAVULANATE 400-57 MG/5ML PO SUSR: 800.0000 mg | Freq: Two times a day (BID) | ORAL | 0 refills | Status: AC

## 2021-10-15 MED ORDER — AMOXICILLIN-POT CLAVULANATE 875-125 MG PO TABS: 1.0000 | ORAL_TABLET | Freq: Two times a day (BID) | ORAL | 0 refills | Status: DC

## 2021-10-15 NOTE — Progress Notes (Signed)
orders

## 2021-11-02 DIAGNOSIS — Z85828 Personal history of other malignant neoplasm of skin: Secondary | ICD-10-CM | POA: Diagnosis not present

## 2021-11-02 DIAGNOSIS — L570 Actinic keratosis: Secondary | ICD-10-CM | POA: Diagnosis not present

## 2021-11-02 DIAGNOSIS — L821 Other seborrheic keratosis: Secondary | ICD-10-CM | POA: Diagnosis not present

## 2021-11-02 DIAGNOSIS — C44519 Basal cell carcinoma of skin of other part of trunk: Secondary | ICD-10-CM | POA: Diagnosis not present

## 2022-06-16 ENCOUNTER — Telehealth: Payer: Self-pay | Admitting: Family Medicine

## 2022-06-16 DIAGNOSIS — Z0000 Encounter for general adult medical examination without abnormal findings: Secondary | ICD-10-CM

## 2022-06-16 DIAGNOSIS — R7309 Other abnormal glucose: Secondary | ICD-10-CM

## 2022-06-16 DIAGNOSIS — E7800 Pure hypercholesterolemia, unspecified: Secondary | ICD-10-CM

## 2022-06-16 NOTE — Telephone Encounter (Signed)
-----   Message from Velna Hatchet, RT sent at 05/31/2022  4:11 PM EST ----- Regarding: Fri 1/5 lab Patient is scheduled for cpx, please order future labs.  Thanks, Anda Kraft

## 2022-06-18 ENCOUNTER — Other Ambulatory Visit (INDEPENDENT_AMBULATORY_CARE_PROVIDER_SITE_OTHER): Payer: 59

## 2022-06-18 ENCOUNTER — Encounter: Payer: 59 | Admitting: Family Medicine

## 2022-06-18 DIAGNOSIS — E7800 Pure hypercholesterolemia, unspecified: Secondary | ICD-10-CM | POA: Diagnosis not present

## 2022-06-18 DIAGNOSIS — R7309 Other abnormal glucose: Secondary | ICD-10-CM

## 2022-06-18 DIAGNOSIS — Z0000 Encounter for general adult medical examination without abnormal findings: Secondary | ICD-10-CM | POA: Diagnosis not present

## 2022-06-18 LAB — CBC WITH DIFFERENTIAL/PLATELET
Basophils Absolute: 0 10*3/uL (ref 0.0–0.1)
Basophils Relative: 1.1 % (ref 0.0–3.0)
Eosinophils Absolute: 0.1 10*3/uL (ref 0.0–0.7)
Eosinophils Relative: 1.3 % (ref 0.0–5.0)
HCT: 38.3 % (ref 36.0–46.0)
Hemoglobin: 12.6 g/dL (ref 12.0–15.0)
Lymphocytes Relative: 55.1 % — ABNORMAL HIGH (ref 12.0–46.0)
Lymphs Abs: 2.3 10*3/uL (ref 0.7–4.0)
MCHC: 32.9 g/dL (ref 30.0–36.0)
MCV: 98 fl (ref 78.0–100.0)
Monocytes Absolute: 0.4 10*3/uL (ref 0.1–1.0)
Monocytes Relative: 9.8 % (ref 3.0–12.0)
Neutro Abs: 1.4 10*3/uL (ref 1.4–7.7)
Neutrophils Relative %: 32.7 % — ABNORMAL LOW (ref 43.0–77.0)
Platelets: 318 10*3/uL (ref 150.0–400.0)
RBC: 3.9 Mil/uL (ref 3.87–5.11)
RDW: 13 % (ref 11.5–15.5)
WBC: 4.1 10*3/uL (ref 4.0–10.5)

## 2022-06-18 LAB — COMPREHENSIVE METABOLIC PANEL
ALT: 15 U/L (ref 0–35)
AST: 19 U/L (ref 0–37)
Albumin: 4.3 g/dL (ref 3.5–5.2)
Alkaline Phosphatase: 71 U/L (ref 39–117)
BUN: 14 mg/dL (ref 6–23)
CO2: 30 mEq/L (ref 19–32)
Calcium: 9.2 mg/dL (ref 8.4–10.5)
Chloride: 106 mEq/L (ref 96–112)
Creatinine, Ser: 0.79 mg/dL (ref 0.40–1.20)
GFR: 80.86 mL/min (ref >60.00)
Glucose, Bld: 92 mg/dL (ref 70–99)
Potassium: 4.7 mEq/L (ref 3.5–5.1)
Sodium: 142 mEq/L (ref 135–145)
Total Bilirubin: 0.3 mg/dL (ref 0.2–1.2)
Total Protein: 6.7 g/dL (ref 6.0–8.3)

## 2022-06-18 LAB — LIPID PANEL
Cholesterol: 214 mg/dL — ABNORMAL HIGH (ref 0–200)
HDL: 65.2 mg/dL (ref >39.00)
LDL Cholesterol: 136 mg/dL — ABNORMAL HIGH (ref 0–99)
NonHDL: 148.59
Total CHOL/HDL Ratio: 3
Triglycerides: 63 mg/dL (ref 0.0–149.0)
VLDL: 12.6 mg/dL (ref 0.0–40.0)

## 2022-06-18 LAB — TSH: TSH: 2.16 u[IU]/mL (ref 0.35–5.50)

## 2022-06-18 LAB — HEMOGLOBIN A1C: Hgb A1c MFr Bld: 5.8 % (ref 4.6–6.5)

## 2022-06-25 ENCOUNTER — Encounter: Payer: Self-pay | Admitting: Family Medicine

## 2022-06-25 ENCOUNTER — Ambulatory Visit (INDEPENDENT_AMBULATORY_CARE_PROVIDER_SITE_OTHER): Payer: 59 | Admitting: Family Medicine

## 2022-06-25 VITALS — BP 110/62 | HR 72 | Temp 97.8°F | Ht 66.5 in | Wt 163.1 lb

## 2022-06-25 DIAGNOSIS — Z1211 Encounter for screening for malignant neoplasm of colon: Secondary | ICD-10-CM | POA: Diagnosis not present

## 2022-06-25 DIAGNOSIS — Z1231 Encounter for screening mammogram for malignant neoplasm of breast: Secondary | ICD-10-CM | POA: Diagnosis not present

## 2022-06-25 DIAGNOSIS — R7309 Other abnormal glucose: Secondary | ICD-10-CM | POA: Diagnosis not present

## 2022-06-25 DIAGNOSIS — E7800 Pure hypercholesterolemia, unspecified: Secondary | ICD-10-CM | POA: Diagnosis not present

## 2022-06-25 DIAGNOSIS — Z0000 Encounter for general adult medical examination without abnormal findings: Secondary | ICD-10-CM | POA: Diagnosis not present

## 2022-06-25 NOTE — Patient Instructions (Addendum)
To prevent diabetes Try to get most of your carbohydrates from produce (with the exception of white potatoes)  Eat less bread/pasta/rice/snack foods/cereals/sweets and other items from the middle of the grocery store (processed carbs)  Also exercise regularly / especially strength training   For bone health Try to get 1200-1500 mg of calcium per day with at least 1000 iu of vitamin D - for bone health   Call and schedule your mammogram at [facility]   Please call the location of your choice from the menu below to schedule your Mammogram and/or Bone Density appointment.    [facility]                      Phone:  [phone], [place] [address]                                                             Services: Traditional and 3D Mammogram, [place] [facility]                 Phone: [phone] [address], [place] [address]    Service: Bone Density ONLY   *this site does NOT perform mammograms  [facility]                        Phone:  [phone] [address], [place] [address]                                            Services:  3D Mammogram and [place] at [facility]   Phone:  [phone]   [address] [place], [place] [address]  Services: 3D Mammogram and Bone Density  [place] [facility])  Phone:  [phone]   [address] [place], [place] [address]                                              Services:  3D Mammogram and Bone  Density

## 2022-06-25 NOTE — Progress Notes (Unsigned)
Subjective:    Patient ID: Meredith [name], female    DOB: [birth year], 62 y.o.   MRN: [id number]  HPI Here for health maintenance exam and to review chronic medical problems    Wt Readings from Last 3 Encounters:  06/25/22 163 lb 2 oz (74 kg)  10/02/21 157 lb 5 oz (71.4 kg)  06/16/21 163 lb 2 oz (74 kg)   25.93 kg/m  Feeling good overall Taking care of herself   For exercise- is doing a lot of outdoor work, splitting Health visitor History  Administered Date(s) Administered   Influenza Whole 03/15/2007, 03/14/2008   Influenza-Unspecified 03/15/2014   PFIZER(Purple Top)SARS-COV-2 Vaccination 12/29/2019, 01/20/2020   Td 02/12/2006, 02/13/2020   Health Maintenance Due  Topic Date Due   HIV Screening  Never done   Hepatitis C Screening  Never done   Zoster Vaccines- Shingrix (1 of 2) Never done   COLONOSCOPY (Pts 45-51yr Insurance coverage will need to be confirmed)  03/04/2018   Shingrix declines   Colonoscopy 2009-not ready to do  Ifob kit 06/2021 neg  Wants to skip screening this year and re evaluate options in a year  No fam history   Pap 02/2020 at gyn- nl with neg HPV  Phys for women  ? Last visit   Mammogram 06/2021 Self breast exam: no lumps or changes   Derm care: she goes soon  There is a mole on R side they are watching - ? Basal cell  Brother had melanoma   Is fairly good about sunscreen- better then used to be   BP Readings from Last 3 Encounters:  06/25/22 110/62  10/02/21 128/76  06/16/21 122/80      Hyperlipidemia Lab Results  Component Value Date   CHOL 214 (H) 06/18/2022   CHOL 241 (H) 06/16/2021   CHOL 209 (H) 12/27/2019   Lab Results  Component Value Date   HDL 65.20 06/18/2022   HDL 76.80 06/16/2021   HDL 68.70 12/27/2019   Lab Results  Component Value Date   LDLCALC 136 (H) 06/18/2022   LDLCALC 145 (H) 06/16/2021   LDLCALC 121 (H) 12/27/2019   Lab Results  Component Value Date   TRIG 63.0 06/18/2022    TRIG 97.0 06/16/2021   TRIG 95.0 12/27/2019   Lab Results  Component Value Date   CHOLHDL 3 06/18/2022   CHOLHDL 3 06/16/2021   CHOLHDL 3 12/27/2019   Lab Results  Component Value Date   LDLDIRECT 140.4 03/01/2012   LDLDIRECT 133.2 01/07/2011   LDLDIRECT 142.0 09/19/2009   Loves shrimp    Glucose Lab Results  Component Value Date   HGBA1C 5.8 06/18/2022   Other labs   Lab Results  Component Value Date   CREATININE 0.79 06/18/2022   BUN 14 06/18/2022   NA 142 06/18/2022   K 4.7 06/18/2022   CL 106 06/18/2022   CO2 30 06/18/2022   Lab Results  Component Value Date   ALT 15 06/18/2022   AST 19 06/18/2022   ALKPHOS 71 06/18/2022   BILITOT 0.3 06/18/2022   Lab Results  Component Value Date   WBC 4.1 06/18/2022   HGB 12.6 06/18/2022   HCT 38.3 06/18/2022   MCV 98.0 06/18/2022   PLT 318.0 06/18/2022   Lab Results  Component Value Date   TSH 2.16 06/18/2022   Patient Active Problem List   Diagnosis Date Noted   Encounter for screening mammogram for breast cancer 06/16/2021   Benign positional vertigo 11/11/2020  Stress reaction 08/25/2020   Colon cancer screening 01/01/2020   Elevated glucose level 01/01/2020   Lumbar degenerative disc disease 03/08/2012   Gynecological examination 01/12/2011   Routine general medical examination at a health care facility 01/03/2011   PLANTAR FASCIITIS, BILATERAL 08/28/2009   HYPERCHOLESTEROLEMIA 10/13/2006   Allergic rhinitis 10/13/2006   GERD 10/13/2006   IBS 10/13/2006   MIGRAINES, HX OF 10/13/2006   Past Medical History:  Diagnosis Date   Acquired keratoderma    Acute sinusitis, unspecified    Allergic rhinitis    Cancer (HCC)    skin   Dysmenorrhea    Esophagitis    GERD (gastroesophageal reflux disease)    h/o  not much now   History of migraine headaches    HLD (hyperlipidemia)    IBS (irritable bowel syndrome)    Obesity    Pain in joint, lower leg    Plantar fascial fibromatosis    Pure  hypercholesterolemia    Past Surgical History:  Procedure Laterality Date   COLONOSCOPY     SHOULDER ARTHROSCOPY WITH OPEN ROTATOR CUFF REPAIR Right 11/27/2015   Procedure: SHOULDER ARTHROSCOPY WITH OPEN ROTATOR CUFF REPAIR;  Surgeon: Thornton Park, MD;  Location: [facility] ORS;  Service: Orthopedics;  Laterality: Right;   treadmill stress test  01/13/11   Social History   Tobacco Use   Smoking status: Never   Smokeless tobacco: Never  Substance Use Topics   Alcohol use: Yes    Alcohol/week: 0.0 standard drinks of alcohol    Comment: Occasional   Drug use: No   Family History  Problem Relation Age of Onset   Heart attack Mother    Hypertension Mother    Lung cancer Father        + smoker   GER disease Father        esophageal stricture   Other Sister 37       MAC disease   Hypertension Brother    Melanoma Brother    No Known Allergies No current outpatient medications on file prior to visit.   No current facility-administered medications on file prior to visit.      Review of Systems  Constitutional:  Negative for activity change, appetite change, fatigue, fever and unexpected weight change.  HENT:  Negative for congestion, ear pain, rhinorrhea, sinus pressure and sore throat.   Eyes:  Negative for pain, redness and visual disturbance.  Respiratory:  Negative for cough, shortness of breath and wheezing.   Cardiovascular:  Negative for chest pain and palpitations.  Gastrointestinal:  Negative for abdominal pain, blood in stool, constipation and diarrhea.  Endocrine: Negative for polydipsia and polyuria.  Genitourinary:  Negative for dysuria, frequency and urgency.  Musculoskeletal:  Negative for arthralgias, back pain and myalgias.  Skin:  Negative for pallor and rash.  Allergic/Immunologic: Negative for environmental allergies.  Neurological:  Negative for dizziness, syncope and headaches.  Hematological:  Negative for adenopathy. Does not bruise/bleed easily.   Psychiatric/Behavioral:  Negative for decreased concentration and dysphoric mood. The patient is not nervous/anxious.        Stress caring for her husband with brain injury  Overall self care is good        Objective:   Physical Exam Constitutional:      General: She is not in acute distress.    Appearance: Normal appearance. She is well-developed. She is not ill-appearing or diaphoretic.  HENT:     Head: Normocephalic and atraumatic.     Right Ear: Tympanic  membrane, ear canal and external ear normal.     Left Ear: Tympanic membrane, ear canal and external ear normal.     Nose: Nose normal. No congestion.     Mouth/Throat:     Mouth: Mucous membranes are moist.     Pharynx: Oropharynx is clear. No posterior oropharyngeal erythema.  Eyes:     General: No scleral icterus.    Extraocular Movements: Extraocular movements intact.     Conjunctiva/sclera: Conjunctivae normal.     Pupils: Pupils are equal, round, and reactive to light.  Neck:     Thyroid: No thyromegaly.     Vascular: No carotid bruit or JVD.  Cardiovascular:     Rate and Rhythm: Normal rate and regular rhythm.     Pulses: Normal pulses.     Heart sounds: Normal heart sounds.     No gallop.  Pulmonary:     Effort: Pulmonary effort is normal. No respiratory distress.     Breath sounds: Normal breath sounds. No wheezing.     Comments: Good air exch Chest:     Chest wall: No tenderness.  Abdominal:     General: Bowel sounds are normal. There is no distension or abdominal bruit.     Palpations: Abdomen is soft. There is no mass.     Tenderness: There is no abdominal tenderness.     Hernia: No hernia is present.  Genitourinary:    Comments: Breast exam: No mass, nodules, thickening, tenderness, bulging, retraction, inflamation, nipple discharge or skin changes noted.  No axillary or clavicular LA.     Musculoskeletal:        General: No tenderness. Normal range of motion.     Cervical back: Normal range of motion  and neck supple. No rigidity. No muscular tenderness.     Right lower leg: No edema.     Left lower leg: No edema.     Comments: No kyphosis   Lymphadenopathy:     Cervical: No cervical adenopathy.  Skin:    General: Skin is warm and dry.     Coloration: Skin is not pale.     Findings: No erythema or rash.     Comments: Solar lentigines diffusely Scattered sks   Neurological:     Mental Status: She is alert. Mental status is at baseline.     Cranial Nerves: No cranial nerve deficit.     Motor: No abnormal muscle tone.     Coordination: Coordination normal.     Gait: Gait normal.     Deep Tendon Reflexes: Reflexes are normal and symmetric. Reflexes normal.  Psychiatric:        Mood and Affect: Mood normal.        Cognition and Memory: Cognition and memory normal.           Assessment & Plan:   Problem List Items Addressed This Visit       Other   Colon cancer screening    Ifob neg a year ago  Declines any screening for another year  She is aware of options  Last colonoscopy was 2009       Elevated glucose level    Lab Results  Component Value Date   HGBA1C 5.8 06/18/2022  disc imp of low glycemic diet and wt loss to prevent DM2       Encounter for screening mammogram for breast cancer    Order done Stable breast exam Pt will call to schedule  Relevant Orders   MM 3D SCREEN BREAST BILATERAL   HYPERCHOLESTEROLEMIA    Hyperlipidemia Lab Results  Component Value Date   CHOL 214 (H) 06/18/2022   CHOL 241 (H) 06/16/2021   CHOL 209 (H) 12/27/2019   Lab Results  Component Value Date   HDL 65.20 06/18/2022   HDL 76.80 06/16/2021   HDL 68.70 12/27/2019   Lab Results  Component Value Date   LDLCALC 136 (H) 06/18/2022   LDLCALC 145 (H) 06/16/2021   LDLCALC 121 (H) 12/27/2019   Lab Results  Component Value Date   TRIG 63.0 06/18/2022   TRIG 97.0 06/16/2021   TRIG 95.0 12/27/2019   Lab Results  Component Value Date   CHOLHDL 3 06/18/2022    CHOLHDL 3 06/16/2021   CHOLHDL 3 12/27/2019   Lab Results  Component Value Date   LDLDIRECT 140.4 03/01/2012   LDLDIRECT 133.2 01/07/2011   LDLDIRECT 142.0 09/19/2009   LDL is down at 136  Disc goals for this  May need to cut down on shellfish        Routine general medical examination at a health care facility - Primary    Reviewed health habits including diet and exercise and skin cancer prevention Reviewed appropriate screening tests for age  Also reviewed health mt list, fam hx and immunization status , as well as social and family history   See HPI Labs reviewed Declines shingrix vaccine Declines colonoscopy , had neg ifob 06/2021 and wants to skip any type of screening for another year  Pap utd 2021 with gyn  (may get dexa there)  Mammogram is sue - it was ordered and she plans to schedule it herself

## 2022-06-27 NOTE — Assessment & Plan Note (Signed)
Lab Results  Component Value Date   HGBA1C 5.8 06/18/2022   disc imp of low glycemic diet and wt loss to prevent DM2

## 2022-06-27 NOTE — Assessment & Plan Note (Signed)
Reviewed health habits including diet and exercise and skin cancer prevention Reviewed appropriate screening tests for age  Also reviewed health mt list, fam hx and immunization status , as well as social and family history   See HPI Labs reviewed Declines shingrix vaccine Declines colonoscopy , had neg ifob 06/2021 and wants to skip any type of screening for another year  Pap utd 2021 with gyn  (may get dexa there)  Mammogram is sue - it was ordered and she plans to schedule it herself

## 2022-06-27 NOTE — Assessment & Plan Note (Signed)
Order done Stable breast exam Pt will call to schedule

## 2022-06-27 NOTE — Assessment & Plan Note (Addendum)
Ifob neg a year ago  Declines any screening for another year  She is aware of options  Last colonoscopy was 2009

## 2022-06-27 NOTE — Assessment & Plan Note (Signed)
Hyperlipidemia Lab Results  Component Value Date   CHOL 214 (H) 06/18/2022   CHOL 241 (H) 06/16/2021   CHOL 209 (H) 12/27/2019   Lab Results  Component Value Date   HDL 65.20 06/18/2022   HDL 76.80 06/16/2021   HDL 68.70 12/27/2019   Lab Results  Component Value Date   LDLCALC 136 (H) 06/18/2022   LDLCALC 145 (H) 06/16/2021   LDLCALC 121 (H) 12/27/2019   Lab Results  Component Value Date   TRIG 63.0 06/18/2022   TRIG 97.0 06/16/2021   TRIG 95.0 12/27/2019   Lab Results  Component Value Date   CHOLHDL 3 06/18/2022   CHOLHDL 3 06/16/2021   CHOLHDL 3 12/27/2019   Lab Results  Component Value Date   LDLDIRECT 140.4 03/01/2012   LDLDIRECT 133.2 01/07/2011   LDLDIRECT 142.0 09/19/2009    LDL is down at 136  Disc goals for this  May need to cut down on shellfish

## 2022-07-19 DIAGNOSIS — L578 Other skin changes due to chronic exposure to nonionizing radiation: Secondary | ICD-10-CM | POA: Diagnosis not present

## 2022-07-19 DIAGNOSIS — L814 Other melanin hyperpigmentation: Secondary | ICD-10-CM | POA: Diagnosis not present

## 2022-07-19 DIAGNOSIS — L821 Other seborrheic keratosis: Secondary | ICD-10-CM | POA: Diagnosis not present

## 2022-07-19 DIAGNOSIS — D225 Melanocytic nevi of trunk: Secondary | ICD-10-CM | POA: Diagnosis not present

## 2022-07-19 DIAGNOSIS — D1801 Hemangioma of skin and subcutaneous tissue: Secondary | ICD-10-CM | POA: Diagnosis not present

## 2022-07-19 DIAGNOSIS — Z85828 Personal history of other malignant neoplasm of skin: Secondary | ICD-10-CM | POA: Diagnosis not present

## 2022-07-19 DIAGNOSIS — D485 Neoplasm of uncertain behavior of skin: Secondary | ICD-10-CM | POA: Diagnosis not present

## 2022-07-19 DIAGNOSIS — L570 Actinic keratosis: Secondary | ICD-10-CM | POA: Diagnosis not present

## 2022-08-11 ENCOUNTER — Ambulatory Visit
Admission: RE | Admit: 2022-08-11 | Discharge: 2022-08-11 | Disposition: A | Discharge status: Home / Self Care | Payer: 59 | Source: Ambulatory Visit | Attending: Family Medicine | Admitting: Family Medicine

## 2022-08-11 DIAGNOSIS — Z1231 Encounter for screening mammogram for malignant neoplasm of breast: Secondary | ICD-10-CM | POA: Insufficient documentation

## 2022-08-23 DIAGNOSIS — C44519 Basal cell carcinoma of skin of other part of trunk: Secondary | ICD-10-CM | POA: Diagnosis not present

## 2022-09-26 IMAGING — DX DG CHEST 2V
2 series · 2 of 2 positions shown · non-contrast
Comparison: None

CLINICAL DATA: Dry cough, shortness of breath, allergy symptoms,
vertigo, central chest pain

EXAM:
CHEST - 2 VIEW

[chest pa]
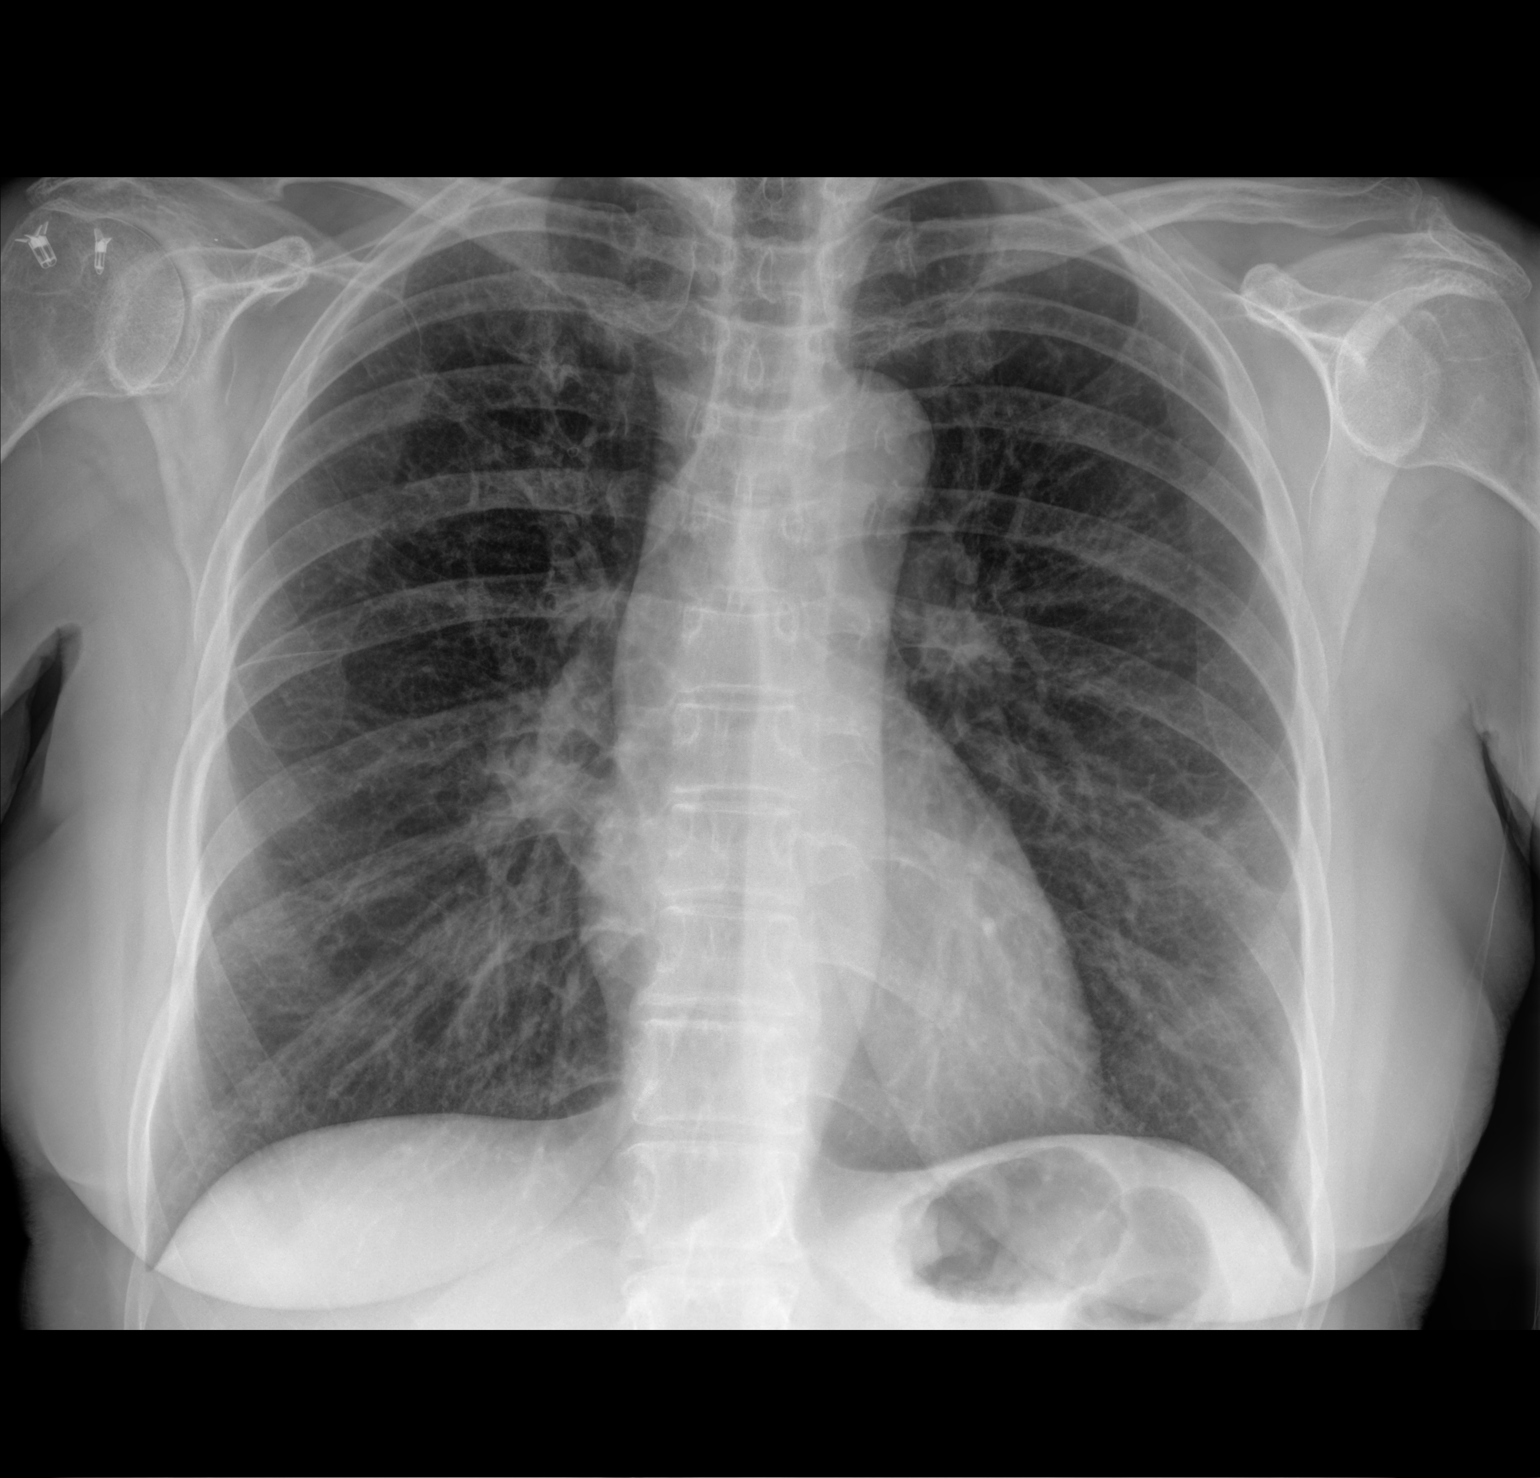

[chest lat]
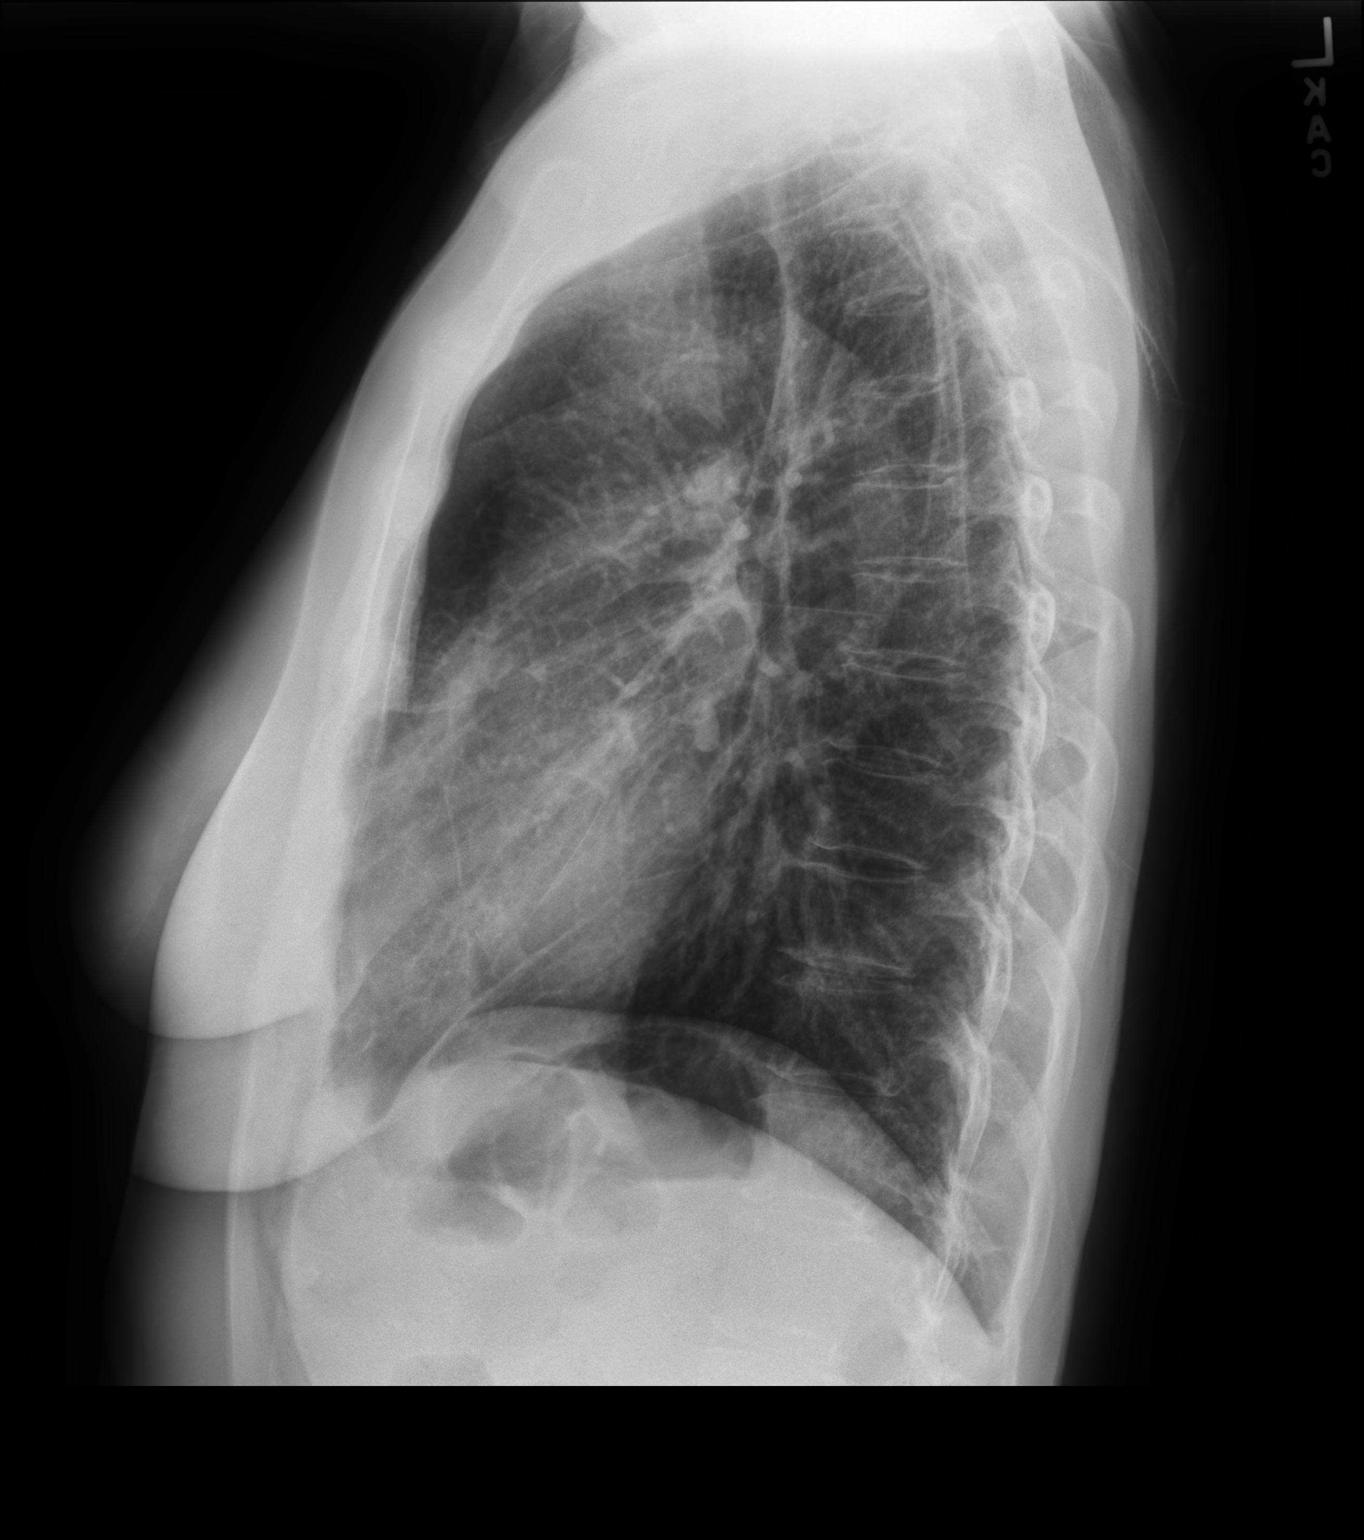

[2 of 2 positions shown; findings below may reference images not displayed]

FINDINGS: Normal heart size, mediastinal contours, and pulmonary vascularity.

Minimal vague opacity at lingula question subtle infiltrate versus
nodule.

Remaining lungs clear. No pulmonary infiltrate, pleural effusion, or
pneumothorax.

Osseous structures unremarkable.
IMPRESSION: Question area of vague infiltrate versus nodular density at lingula;
either radiographic follow-up until resolution or CT chest
recommended to definitively exclude pulmonary nodule.

## 2023-01-18 DIAGNOSIS — L570 Actinic keratosis: Secondary | ICD-10-CM | POA: Diagnosis not present

## 2023-01-18 DIAGNOSIS — D1801 Hemangioma of skin and subcutaneous tissue: Secondary | ICD-10-CM | POA: Diagnosis not present

## 2023-01-18 DIAGNOSIS — L814 Other melanin hyperpigmentation: Secondary | ICD-10-CM | POA: Diagnosis not present

## 2023-01-18 DIAGNOSIS — L578 Other skin changes due to chronic exposure to nonionizing radiation: Secondary | ICD-10-CM | POA: Diagnosis not present

## 2023-01-18 DIAGNOSIS — L821 Other seborrheic keratosis: Secondary | ICD-10-CM | POA: Diagnosis not present

## 2023-01-18 DIAGNOSIS — D225 Melanocytic nevi of trunk: Secondary | ICD-10-CM | POA: Diagnosis not present

## 2023-01-18 DIAGNOSIS — D2261 Melanocytic nevi of right upper limb, including shoulder: Secondary | ICD-10-CM | POA: Diagnosis not present

## 2023-01-18 DIAGNOSIS — L820 Inflamed seborrheic keratosis: Secondary | ICD-10-CM | POA: Diagnosis not present

## 2023-01-18 DIAGNOSIS — Z85828 Personal history of other malignant neoplasm of skin: Secondary | ICD-10-CM | POA: Diagnosis not present

## 2023-02-11 ENCOUNTER — Encounter: Payer: Self-pay | Admitting: Internal Medicine

## 2023-02-11 ENCOUNTER — Ambulatory Visit: Payer: 59 | Admitting: Internal Medicine

## 2023-02-11 VITALS — BP 122/84 | HR 75 | Temp 97.8°F | Ht 66.5 in | Wt 155.0 lb

## 2023-02-11 DIAGNOSIS — R42 Dizziness and giddiness: Secondary | ICD-10-CM | POA: Diagnosis not present

## 2023-02-11 MED ORDER — MECLIZINE HCL 25 MG PO CHEW: 25.0000 mg | CHEWABLE_TABLET | Freq: Three times a day (TID) | ORAL | 1 refills | Status: AC | PRN

## 2023-02-11 NOTE — Assessment & Plan Note (Signed)
Different than in the past--but discussed that her turning in bed symptoms are classic No neurologic findings Reassured Discussed reducing salt--especially with travel Rx--meclizine 25 tid prn

## 2023-02-11 NOTE — Progress Notes (Signed)
Subjective:    Patient ID: Meredith [name], female    DOB: 1960/07/31, 62 y.o.   MRN: [id number]  HPI Here due to possible vertigo  Has had past problems--usually with bending over Noticed pressure in left ear this morning Has noticed dizziness just in bed with turning over--for last 3 nights. "Big woosh" like things are spinning No problems working in yard, etc  No URI symptoms or fever  No current outpatient medications on file prior to visit.   No current facility-administered medications on file prior to visit.    No Known Allergies  Past Medical History:  Diagnosis Date   Acquired keratoderma    Acute sinusitis, unspecified    Allergic rhinitis    Cancer (HCC)    skin   Dysmenorrhea    Esophagitis    GERD (gastroesophageal reflux disease)    h/o  not much now   History of migraine headaches    HLD (hyperlipidemia)    IBS (irritable bowel syndrome)    Obesity    Pain in joint, lower leg    Plantar fascial fibromatosis    Pure hypercholesterolemia     Past Surgical History:  Procedure Laterality Date   COLONOSCOPY     SHOULDER ARTHROSCOPY WITH OPEN ROTATOR CUFF REPAIR Right 11/27/2015   Procedure: SHOULDER ARTHROSCOPY WITH OPEN ROTATOR CUFF REPAIR;  Surgeon: Juanell Fairly, MD;  Location: [facility] OR[facility];  Service: Orthopedics;  Laterality: Right;   treadmill stress test  01/13/11    Family History  Problem Relation Age of Onset   Heart attack Mother    Hypertension Mother    Lung cancer Father        + smoker   GER disease Father        esophageal stricture   Other Sister 40       MAC disease   Hypertension Brother    Melanoma Brother     Social History   Socioeconomic History   Marital status: Married    Spouse name: Not on file   Number of children: Not on file   Years of education: Not on file   Highest education level: Not on file  Occupational History   Not on file  Tobacco Use   Smoking status: Never   Smokeless tobacco: Never   Substance and Sexual Activity   Alcohol use: Yes    Alcohol/week: 0.0 standard drinks of alcohol    Comment: Occasional   Drug use: No   Sexual activity: Yes    Birth control/protection: Other-see comments    Comment: vasectomy  Other Topics Concern   Not on file  Social History Narrative   Not on file   Social Determinants of Health   Financial Resource Strain: Not on file  Food Insecurity: Not on file  Transportation Needs: Not on file  Physical Activity: Not on file  Stress: Not on file  Social Connections: Not on file  Intimate Partner Violence: Not on file   Review of Systems No trouble walking No tinnitus or apparent hearing loss Upcoming car trip to [employer] No diet changes---does use salt    Objective:   Physical Exam Constitutional:      Appearance: Normal appearance.  HENT:     Right Ear: Tympanic membrane and ear canal normal.     Left Ear: Tympanic membrane and ear canal normal.     Mouth/Throat:     Pharynx: No oropharyngeal exudate or posterior oropharyngeal erythema.  Eyes:  Extraocular Movements: Extraocular movements intact.     Pupils: Pupils are equal, round, and reactive to light.     Comments: No nystagmus  Neurological:     Mental Status: She is alert.     Cranial Nerves: No cranial nerve deficit.     Motor: No weakness.     Coordination: Romberg sign negative.     Gait: Gait normal.            Assessment & Plan:

## 2023-09-22 ENCOUNTER — Ambulatory Visit: Payer: Self-pay | Admitting: Family Medicine

## 2023-09-22 ENCOUNTER — Encounter: Payer: Self-pay | Admitting: Family Medicine

## 2023-09-22 VITALS — BP 136/86 | HR 67 | Temp 98.0°F | Ht 66.25 in | Wt 164.1 lb

## 2023-09-22 DIAGNOSIS — R7309 Other abnormal glucose: Secondary | ICD-10-CM

## 2023-09-22 DIAGNOSIS — E7800 Pure hypercholesterolemia, unspecified: Secondary | ICD-10-CM | POA: Diagnosis not present

## 2023-09-22 DIAGNOSIS — Z0000 Encounter for general adult medical examination without abnormal findings: Secondary | ICD-10-CM

## 2023-09-22 DIAGNOSIS — L989 Disorder of the skin and subcutaneous tissue, unspecified: Secondary | ICD-10-CM

## 2023-09-22 DIAGNOSIS — Z1211 Encounter for screening for malignant neoplasm of colon: Secondary | ICD-10-CM

## 2023-09-22 DIAGNOSIS — Z1231 Encounter for screening mammogram for malignant neoplasm of breast: Secondary | ICD-10-CM

## 2023-09-22 LAB — CBC WITH DIFFERENTIAL/PLATELET
Basophils Absolute: 0 10*3/uL (ref 0.0–0.1)
Basophils Relative: 0.7 % (ref 0.0–3.0)
Eosinophils Absolute: 0 10*3/uL (ref 0.0–0.7)
Eosinophils Relative: 0.4 % (ref 0.0–5.0)
HCT: 39.5 % (ref 36.0–46.0)
Hemoglobin: 12.9 g/dL (ref 12.0–15.0)
Lymphocytes Relative: 41.3 % (ref 12.0–46.0)
Lymphs Abs: 2.4 10*3/uL (ref 0.7–4.0)
MCHC: 32.6 g/dL (ref 30.0–36.0)
MCV: 100.1 fl — ABNORMAL HIGH (ref 78.0–100.0)
Monocytes Absolute: 0.4 10*3/uL (ref 0.1–1.0)
Monocytes Relative: 6.5 % (ref 3.0–12.0)
Neutro Abs: 3 10*3/uL (ref 1.4–7.7)
Neutrophils Relative %: 51.1 % (ref 43.0–77.0)
Platelets: 239 10*3/uL (ref 150.0–400.0)
RBC: 3.94 Mil/uL (ref 3.87–5.11)
RDW: 13.7 % (ref 11.5–15.5)
WBC: 5.9 10*3/uL (ref 4.0–10.5)

## 2023-09-22 LAB — COMPREHENSIVE METABOLIC PANEL WITH GFR
ALT: 16 U/L (ref 0–35)
AST: 22 U/L (ref 0–37)
Albumin: 5 g/dL (ref 3.5–5.2)
Alkaline Phosphatase: 67 U/L (ref 39–117)
BUN: 13 mg/dL (ref 6–23)
CO2: 29 meq/L (ref 19–32)
Calcium: 9.3 mg/dL (ref 8.4–10.5)
Chloride: 103 meq/L (ref 96–112)
Creatinine, Ser: 0.78 mg/dL (ref 0.40–1.20)
GFR: 81.39 mL/min (ref >60.00)
Glucose, Bld: 99 mg/dL (ref 70–99)
Potassium: 4.6 meq/L (ref 3.5–5.1)
Sodium: 139 meq/L (ref 135–145)
Total Bilirubin: 0.4 mg/dL (ref 0.2–1.2)
Total Protein: 7.3 g/dL (ref 6.0–8.3)

## 2023-09-22 LAB — TSH: TSH: 2.46 u[IU]/mL (ref 0.35–5.50)

## 2023-09-22 LAB — LIPID PANEL
Cholesterol: 257 mg/dL — ABNORMAL HIGH (ref 0–200)
HDL: 84.5 mg/dL (ref >39.00)
LDL Cholesterol: 151 mg/dL — ABNORMAL HIGH (ref 0–99)
NonHDL: 172.55
Total CHOL/HDL Ratio: 3
Triglycerides: 109 mg/dL (ref 0.0–149.0)
VLDL: 21.8 mg/dL (ref 0.0–40.0)

## 2023-09-22 LAB — HEMOGLOBIN A1C: Hgb A1c MFr Bld: 5.4 % (ref 4.6–6.5)

## 2023-09-22 NOTE — Assessment & Plan Note (Signed)
 On right forehead  Previously ak -treated by derm  Now scabbed and will not heal  Ref to derm   Encouraged sun protection

## 2023-09-22 NOTE — Assessment & Plan Note (Signed)
 Mammogram ordered

## 2023-09-22 NOTE — Assessment & Plan Note (Signed)
 Declines colon cancer screening  Discussed opt of colonoscopy , cologuard or ifob

## 2023-09-22 NOTE — Assessment & Plan Note (Signed)
 Disc goals for lipids and reasons to control them Rev last labs with pt Rev low sat fat diet in detail  Labs today Diet controlled

## 2023-09-22 NOTE — Addendum Note (Signed)
 Addended by: Roxy Manns A on: 09/22/2023 05:10 PM   Modules accepted: Orders

## 2023-09-22 NOTE — Progress Notes (Signed)
 Subjective:    Patient ID: Meredith [name], female    DOB: 1960/08/25, 63 y.o.   MRN: [id number]  HPI  Here for health maintenance exam and to review chronic medical problems   Wt Readings from Last 3 Encounters:  09/22/23 164 lb 2 oz (74.4 kg)  02/11/23 155 lb (70.3 kg)  06/25/22 163 lb 2 oz (74 kg)   26.29 kg/m  Vitals:   09/22/23 1132  BP: 136/86  Pulse: 67  Temp: 98 F (36.7 C)  SpO2: 99%    Immunization History  Administered Date(s) Administered   Influenza Whole 03/15/2007, 03/14/2008   Influenza-Unspecified 03/15/2014   PFIZER(Purple Top)SARS-COV-2 Vaccination 12/29/2019, 01/20/2020   Td 02/12/2006, 02/13/2020    Health Maintenance Due  Topic Date Due   Colonoscopy  03/04/2018   Cervical Cancer Screening (HPV/Pap Cotest)  02/13/2023   Shingrix -declines    Mammogram 07/2022  Self breast exam  Gyn health Pap 02/2020 neg at gyn  Wants to do breast exam here today    Colon cancer screening  colonoscopy 02/2008  Ifob neg 2023  Declines any screening at all  No family history    Bone health   Falls-none  Fractures-none  Supplements -none   Exercise :  Yard work  Has some weights  Has done chair yoga      Mood    09/22/2023   12:44 PM 06/25/2022    8:37 AM 06/16/2021    9:01 AM 01/01/2020   10:11 AM  Depression screen PHQ 2/9  Decreased Interest 0 0 0 0  Down, Depressed, Hopeless 0 0 0 1  PHQ - 2 Score 0 0 0 1  Altered sleeping 0 1 1 0  Tired, decreased energy 0 0 0 1  Change in appetite 0 0 0 1  Feeling bad or failure about yourself  0 0 0 1  Trouble concentrating 0 0 0 0  Moving slowly or fidgety/restless 0 0 0 0  Suicidal thoughts 0 0 0 0  PHQ-9 Score 0 1 1 4   Difficult doing work/chores Not difficult at all Not difficult at all Not difficult at all     Glucose Lab Results  Component Value Date   HGBA1C 5.8 06/18/2022   HGBA1C 5.6 06/16/2021   Hyperlipidemia Lab Results  Component Value Date   CHOL 214 (H)  06/18/2022   HDL 65.20 06/18/2022   LDLCALC 136 (H) 06/18/2022   LDLDIRECT 140.4 03/01/2012   TRIG 63.0 06/18/2022   CHOLHDL 3 06/18/2022   Diet controlled   Eating has been fairly healthy  More stressed  Wants to get back on track       Patient Active Problem List   Diagnosis Date Noted   Skin lesion 09/22/2023   Encounter for screening mammogram for breast cancer 06/16/2021   Benign positional vertigo 11/11/2020   Stress reaction 08/25/2020   Colon cancer screening 01/01/2020   Elevated glucose level 01/01/2020   Vertigo 05/17/2013   Lumbar degenerative disc disease 03/08/2012   Encounter for gynecological examination 01/12/2011   Routine general medical examination at a health care facility 01/03/2011   PLANTAR FASCIITIS, BILATERAL 08/28/2009   HYPERCHOLESTEROLEMIA 10/13/2006   Allergic rhinitis 10/13/2006   GERD 10/13/2006   IBS 10/13/2006   MIGRAINES, HX OF 10/13/2006   Past Medical History:  Diagnosis Date   Acquired keratoderma    Acute sinusitis, unspecified    Allergic rhinitis    Cancer (HCC)    skin   Dysmenorrhea  Esophagitis    GERD (gastroesophageal reflux disease)    h/o  not much now   History of migraine headaches    HLD (hyperlipidemia)    IBS (irritable bowel syndrome)    Obesity    Pain in joint, lower leg    Plantar fascial fibromatosis    Pure hypercholesterolemia    Ulcer    Past Surgical History:  Procedure Laterality Date   COLONOSCOPY     SHOULDER ARTHROSCOPY WITH OPEN ROTATOR CUFF REPAIR Right 11/27/2015   Procedure: SHOULDER ARTHROSCOPY WITH OPEN ROTATOR CUFF REPAIR;  Surgeon: Juanell Fairly, MD;  Location: [facility] OR[facility];  Service: Orthopedics;  Laterality: Right;   treadmill stress test  01/13/11   Social History   Tobacco Use   Smoking status: Never   Smokeless tobacco: Never  Substance Use Topics   Alcohol use: Yes    Comment: Occasional   Drug use: No   Family History  Problem Relation Age of Onset   Heart attack  Mother    Hypertension Mother    Lung cancer Father        + smoker   GER disease Father        esophageal stricture   Cancer Father    Other Sister 55       MAC disease   Hypertension Brother    Melanoma Brother    No Known Allergies Current Outpatient Medications on File Prior to Visit  Medication Sig Dispense Refill   Meclizine HCl 25 MG CHEW Chew 1 tablet (25 mg total) by mouth 3 (three) times daily as needed. 90 tablet 1   No current facility-administered medications on file prior to visit.    Review of Systems  Constitutional:  Positive for unexpected weight change. Negative for activity change, appetite change, fatigue and fever.  HENT:  Negative for congestion, ear pain, rhinorrhea, sinus pressure and sore throat.   Eyes:  Negative for pain, redness and visual disturbance.  Respiratory:  Negative for cough, shortness of breath and wheezing.   Cardiovascular:  Negative for chest pain and palpitations.  Gastrointestinal:  Negative for abdominal pain, blood in stool, constipation and diarrhea.  Endocrine: Negative for polydipsia and polyuria.  Genitourinary:  Negative for dysuria, frequency and urgency.  Musculoskeletal:  Negative for arthralgias, back pain and myalgias.  Skin:  Negative for pallor and rash.       Scab on forehead/ will not heal  Allergic/Immunologic: Negative for environmental allergies.  Neurological:  Negative for dizziness, syncope and headaches.  Hematological:  Negative for adenopathy. Does not bruise/bleed easily.  Psychiatric/Behavioral:  Negative for decreased concentration and dysphoric mood. The patient is not nervous/anxious.        Some caregiver stress       Objective:   Physical Exam Constitutional:      General: She is not in acute distress.    Appearance: Normal appearance. She is well-developed and normal weight. She is not ill-appearing or diaphoretic.  HENT:     Head: Normocephalic and atraumatic.     Right Ear: Tympanic  membrane, ear canal and external ear normal.     Left Ear: Tympanic membrane, ear canal and external ear normal.     Nose: Nose normal. No congestion.     Mouth/Throat:     Mouth: Mucous membranes are moist.     Pharynx: Oropharynx is clear. No posterior oropharyngeal erythema.  Eyes:     General: No scleral icterus.    Extraocular Movements: Extraocular movements intact.  Conjunctiva/sclera: Conjunctivae normal.     Pupils: Pupils are equal, round, and reactive to light.  Neck:     Thyroid: No thyromegaly.     Vascular: No carotid bruit or JVD.  Cardiovascular:     Rate and Rhythm: Normal rate and regular rhythm.     Pulses: Normal pulses.     Heart sounds: Normal heart sounds.     No gallop.  Pulmonary:     Effort: Pulmonary effort is normal. No respiratory distress.     Breath sounds: Normal breath sounds. No wheezing.     Comments: Good air exch Chest:     Chest wall: No tenderness.  Abdominal:     General: Bowel sounds are normal. There is no distension or abdominal bruit.     Palpations: Abdomen is soft. There is no mass.     Tenderness: There is no abdominal tenderness.     Hernia: No hernia is present.  Genitourinary:    Comments: Breast exam: No mass, nodules, thickening, tenderness, bulging, retraction, inflamation, nipple discharge or skin changes noted.  No axillary or clavicular LA.     Musculoskeletal:        General: No tenderness. Normal range of motion.     Cervical back: Normal range of motion and neck supple. No rigidity. No muscular tenderness.     Right lower leg: No edema.     Left lower leg: No edema.     Comments: No kyphosis   Lymphadenopathy:     Cervical: No cervical adenopathy.  Skin:    General: Skin is warm and dry.     Coloration: Skin is not pale.     Findings: No erythema or rash.     Comments: Solar lentigines diffusely Tanned   Sks and aks   Scabbed lesion 2-3 mm right forehead     Neurological:     Mental Status: She is  alert. Mental status is at baseline.     Cranial Nerves: No cranial nerve deficit.     Motor: No abnormal muscle tone.     Coordination: Coordination normal.     Gait: Gait normal.     Deep Tendon Reflexes: Reflexes are normal and symmetric. Reflexes normal.  Psychiatric:        Mood and Affect: Mood normal.        Cognition and Memory: Cognition and memory normal.           Assessment & Plan:   Problem List Items Addressed This Visit       Musculoskeletal and Integument   Skin lesion   On right forehead  Previously ak -treated by derm  Now scabbed and will not heal  Ref to derm   Encouraged sun protection       Relevant Orders   Ambulatory referral to Dermatology     Other   Routine general medical examination at a health care facility - Primary   Reviewed health habits including diet and exercise and skin cancer prevention Reviewed appropriate screening tests for age  Also reviewed health mt list, fam hx and immunization status , as well as social and family history   See HPI Labs reviewed and ordered Health Maintenance  Topic Date Due   Colon Cancer Screening  03/04/2018   Pap with HPV screening  02/13/2023   Zoster (Shingles) Vaccine (1 of 2) 12/22/2023*   Flu Shot  09/11/2024*   Hepatitis C Screening  09/21/2024*   HIV Screening  09/21/2024*   COVID-19  Vaccine (3 - 2024-25 season) 10/07/2024*   Mammogram  08/11/2024   DTaP/Tdap/Td vaccine (3 - Tdap) 02/12/2030   HPV Vaccine  Aged Out   Meningitis B Vaccine  Aged Out  *Topic was postponed. The date shown is not the original due date.   Declines colon cancer screening  Declines shingrix  Mammogram ordered  Sees gyn for pap  Discussed fall prevention, supplements and exercise for bone density  PHQ 0   Lab today       Relevant Orders   TSH   Lipid Panel   Comprehensive metabolic panel with GFR   CBC with Differential/Platelet   HYPERCHOLESTEROLEMIA   Disc goals for lipids and reasons to control  them Rev last labs with pt Rev low sat fat diet in detail  Labs today  Diet controlled        Relevant Orders   Lipid Panel   Comprehensive metabolic panel with GFR   Encounter for screening mammogram for breast cancer   Mammogram ordered       Relevant Orders   MM 3D SCREENING MAMMOGRAM BILATERAL BREAST   Elevated glucose level   A1c ordered  disc imp of low glycemic diet and wt loss to prevent DM2       Relevant Orders   Hemoglobin A1c   Colon cancer screening   Declines colon cancer screening  Discussed opt of colonoscopy , cologuard or ifob

## 2023-09-22 NOTE — Assessment & Plan Note (Signed)
 Reviewed health habits including diet and exercise and skin cancer prevention Reviewed appropriate screening tests for age  Also reviewed health mt list, fam hx and immunization status , as well as social and family history   See HPI Labs reviewed and ordered Health Maintenance  Topic Date Due   Colon Cancer Screening  03/04/2018   Pap with HPV screening  02/13/2023   Zoster (Shingles) Vaccine (1 of 2) 12/22/2023*   Flu Shot  09/11/2024*   Hepatitis C Screening  09/21/2024*   HIV Screening  09/21/2024*   COVID-19 Vaccine (3 - 2024-25 season) 10/07/2024*   Mammogram  08/11/2024   DTaP/Tdap/Td vaccine (3 - Tdap) 02/12/2030   HPV Vaccine  Aged Out   Meningitis B Vaccine  Aged Out  *Topic was postponed. The date shown is not the original due date.   Declines colon cancer screening  Declines shingrix  Mammogram ordered  Sees gyn for pap  Discussed fall prevention, supplements and exercise for bone density  PHQ 0   Lab today

## 2023-09-22 NOTE — Patient Instructions (Addendum)
 Try to get 1200-1500 mg of calcium per day with at least 2000 iu of vitamin D - for bone health  Is over the counter  Lots of options for chewable forms   Try to exercise regularly  Add some strength training to your routine, this is important for bone and brain health and can reduce your risk of falls and help your body use insulin properly and regulate weight  Light weights, exercise bands , and internet videos are a good way to start  Yoga (chair or regular), machines , floor exercises or a gym with machines are also good options   For weight and blood sugar  Try to get most of your carbohydrates from produce (with the exception of white potatoes) and whole grains Eat less bread/pasta/rice/snack foods/cereals/sweets and other items from the middle of the grocery store (processed carbs)  For cholesterol  Avoid red meat/ fried foods/ egg yolks/ fatty breakfast meats/ butter, cheese and high fat dairy/ and shellfish   I put the referral in dermatology  Please let us know if you don't hear in 1-2 weeks   Wear your sun protection    Labs today    You have an order for:  []   2D Mammogram  [x]   3D Mammogram  []   Bone Density     Please call for appointment:   [x]   [facility]  [address] [place] [address]  [phone]  []   [facility])   [address]  [place], [place] [address]  [phone]  []   [facility]      [address] [place], [place]        [phone]         []   [facility]  [address] [place], [place]  [phone]  []  [facility]   [address]   [place], [place] [address]  [phone]  []  [facility]  [address] [place], [place] [address] [phone]    Make sure to wear two  piece clothing  No lotions powders or deodorants the day of the appointment Make sure to bring picture ID and insurance card.  Bring list of medications you are currently taking including any supplements.   Schedule your screening mammogram through MyChart!   Select [place] imaging sites can now be scheduled through MyChart.  Log into your MyChart account.  Go to 'Visit' (or 'Appointments' if  on mobile App) --> Schedule an  Appointment  Under 'Select a Reason for Visit' choose the Mammogram  Screening option.  Complete the pre-visit questions  and select the time and place that  best fits your schedule

## 2023-09-22 NOTE — Assessment & Plan Note (Signed)
 A1c ordered  disc imp of low glycemic diet and wt loss to prevent DM2

## 2023-09-23 ENCOUNTER — Telehealth: Payer: Self-pay | Admitting: *Deleted

## 2023-09-23 NOTE — Telephone Encounter (Signed)
 Pt viewed her recent labs via mychart but per Dr. Milinda Antis:  would like to re check a lipid profile in 3 months (fasting)  *please schedule fasting lab appt in 3 months. Thanks

## 2023-09-26 NOTE — Telephone Encounter (Signed)
 Patient scheduled.

## 2023-12-01 ENCOUNTER — Ambulatory Visit: Payer: Self-pay

## 2023-12-01 ENCOUNTER — Other Ambulatory Visit: Payer: Self-pay | Admitting: Family Medicine

## 2023-12-01 MED ORDER — PANTOPRAZOLE SODIUM 40 MG PO TBEC: 40.0000 mg | DELAYED_RELEASE_TABLET | Freq: Every day | ORAL | 3 refills | Status: DC

## 2023-12-01 NOTE — Telephone Encounter (Signed)
 FYI Only or Action Required?: FYI only for provider.  Patient was last seen in primary care on 09/22/2023 by Clemens Curt, MD. Called Nurse Triage reporting Chest Pain. Symptoms began several months ago. Interventions attempted: OTC medications: Tums. Symptoms are: gradually worsening.  Triage Disposition: Call PCP Within 24 Hours  Patient/caregiver understands and will follow disposition?: YES  Pt stated has had acid reflux her whole life and is wanting a prescription.     Copied from CRM [id number]. Topic: Clinical - Red Word Triage >> Dec 01, 2023 10:03 AM Kita Perish H wrote: [facility] that prompted transfer to Nurse Triage: Chest burning so bad in chest and throat, heaviness in chest Reason for Disposition  [1] Patient says chest pain feels exactly the same as previously diagnosed heartburn AND [2] describes burning in chest AND [3] accompanying sour taste in mouth  Answer Assessment - Initial Assessment Questions 1. LOCATION: Where does it hurt?       Chest, throat  2. RADIATION: Does the pain go anywhere else? (e.g., into neck, jaw, arms, back)     Back only when pain is bad after eating was miserable 3. ONSET: When did the chest pain begin? (Minutes, hours or days)      2 months worse but has had all her life  4. PATTERN: Does the pain come and go, or has it been constant since it started?  Does it get worse with exertion?      Comes and goes  5. DURATION: How long does it last (e.g., seconds, minutes, hours)     Sometimes drink water smaller meals - 2 hours then back after drinking fluids 6. SEVERITY: How bad is the pain?  (e.g., Scale 1-10; mild, moderate, or severe)    - MILD (1-3): doesn't interfere with normal activities     - MODERATE (4-7): interferes with normal activities or awakens from sleep    - SEVERE (8-10): excruciating pain, unable to do any normal activities       0 last night was 8 7. CARDIAC RISK FACTORS: Do you have any history of heart  problems or risk factors for heart disease? (e.g., angina, prior heart attack; diabetes, high blood pressure, high cholesterol, smoker, or strong family history of heart disease)     Overweight, family hx of cardiac problems 8. PULMONARY RISK FACTORS: Do you have any history of lung disease?  (e.g., blood clots in lung, asthma, emphysema, birth control pills)     no 9. CAUSE: What do you think is causing the chest pain?     Acid reflux 10. OTHER SYMPTOMS: Do you have any other symptoms? (e.g., dizziness, nausea, vomiting, sweating, fever, difficulty breathing, cough)       Heaviness when chest burning really bad- none now, can feels burning down water or food trigger it 11. PREGNANCY: Is there any chance you are pregnant? When was your last menstrual period?       N/a  Protocols used: Chest Pain-A-AH

## 2023-12-01 NOTE — Telephone Encounter (Signed)
 I sent in protonix  which is the last thing she tried  Follow up in 3-4 weeks for re check  Thanks for the heads up  Watch diet for triggers as well   If worse or severe- ER

## 2023-12-02 ENCOUNTER — Ambulatory Visit: Admitting: Family Medicine

## 2023-12-02 NOTE — Telephone Encounter (Signed)
 Called patient gave information. She had office visit for evaluation today but we have moved out to follow up time. She will call if any questions.

## 2023-12-06 ENCOUNTER — Ambulatory Visit (INDEPENDENT_AMBULATORY_CARE_PROVIDER_SITE_OTHER): Admitting: Internal Medicine

## 2023-12-06 ENCOUNTER — Ambulatory Visit (INDEPENDENT_AMBULATORY_CARE_PROVIDER_SITE_OTHER)
Admission: RE | Admit: 2023-12-06 | Discharge: 2023-12-06 | Disposition: A | Discharge status: Home / Self Care | Source: Ambulatory Visit | Attending: Internal Medicine | Admitting: Internal Medicine

## 2023-12-06 ENCOUNTER — Encounter: Payer: Self-pay | Admitting: Internal Medicine

## 2023-12-06 ENCOUNTER — Ambulatory Visit: Payer: Self-pay | Admitting: Internal Medicine

## 2023-12-06 ENCOUNTER — Ambulatory Visit: Payer: Self-pay

## 2023-12-06 VITALS — BP 138/84 | HR 70 | Temp 98.3°F | Ht 66.25 in | Wt 166.0 lb

## 2023-12-06 DIAGNOSIS — M79671 Pain in right foot: Secondary | ICD-10-CM | POA: Insufficient documentation

## 2023-12-06 NOTE — Telephone Encounter (Signed)
 Noted I will check at the visit today

## 2023-12-06 NOTE — Telephone Encounter (Signed)
 Aware, will watch for correspondence Thanks for seeing her

## 2023-12-06 NOTE — Progress Notes (Signed)
 Subjective:    Patient ID: Meredith [name], female    DOB: [birth year], 63 y.o.   MRN: [id number]  HPI Here due to right great toe injury  Last night--was trying to get daughter's dog out of her house Somehow tripped and stepped on a dog toy Went down and landed on right knee and somehow on right toe Noticed pain in great toe and knee upon arising (right away)  Was sore but just went to bed This morning--noticed trouble putting pressure on the toe No meds  Current Outpatient Medications on File Prior to Visit  Medication Sig Dispense Refill   Meclizine  HCl 25 MG CHEW Chew 1 tablet (25 mg total) by mouth 3 (three) times daily as needed. 90 tablet 1   pantoprazole  (PROTONIX ) 40 MG tablet Take 1 tablet (40 mg total) by mouth daily. 30 tablet 3   No current facility-administered medications on file prior to visit.    No Known Allergies  Past Medical History:  Diagnosis Date   Acquired keratoderma    Acute sinusitis, unspecified    Allergic rhinitis    Cancer (HCC)    skin   Dysmenorrhea    Esophagitis    GERD (gastroesophageal reflux disease)    h/o  not much now   History of migraine headaches    HLD (hyperlipidemia)    IBS (irritable bowel syndrome)    Obesity    Pain in joint, lower leg    Plantar fascial fibromatosis    Pure hypercholesterolemia    Ulcer     Past Surgical History:  Procedure Laterality Date   COLONOSCOPY     SHOULDER ARTHROSCOPY WITH OPEN ROTATOR CUFF REPAIR Right 11/27/2015   Procedure: SHOULDER ARTHROSCOPY WITH OPEN ROTATOR CUFF REPAIR;  Surgeon: Franky Cranker, MD;  Location: [facility] ORS;  Service: Orthopedics;  Laterality: Right;   treadmill stress test  01/13/11    Family History  Problem Relation Age of Onset   Heart attack Mother    Hypertension Mother    Lung cancer Father        + smoker   GER disease Father        esophageal stricture   Cancer Father    Other Sister 50       MAC disease   Hypertension Brother    Melanoma  Brother     Social History   Socioeconomic History   Marital status: Married    Spouse name: Not on file   Number of children: Not on file   Years of education: Not on file   Highest education level: 12th grade  Occupational History   Not on file  Tobacco Use   Smoking status: Never   Smokeless tobacco: Never  Substance and Sexual Activity   Alcohol use: Yes    Comment: Occasional   Drug use: No   Sexual activity: Not Currently    Birth control/protection: Other-see comments    Comment: vasectomy  Other Topics Concern   Not on file  Social History Narrative   Not on file   Social Drivers of Health   Financial Resource Strain: Low Risk  (09/20/2023)   Overall Financial Resource Strain (CARDIA)    Difficulty of Paying Living Expenses: Not very hard  Food Insecurity: No Food Insecurity (09/20/2023)   Hunger Vital Sign    Worried About Running Out of Food in the Last Year: Never true    Ran Out of Food in the Last Year: Never true  Transportation Needs:  No Transportation Needs (09/20/2023)   PRAPARE - Administrator, Civil Service (Medical): No    Lack of Transportation (Non-Medical): No  Physical Activity: Insufficiently Active (09/20/2023)   Exercise Vital Sign    Days of Exercise per Week: 3 days    Minutes of Exercise per Session: 40 min  Stress: Stress Concern Present (09/20/2023)   [employer] of Occupational Health - Occupational Stress Questionnaire    Feeling of Stress : To some extent  Social Connections: Moderately Integrated (09/20/2023)   Social Connection and Isolation Panel    Frequency of Communication with Friends and Family: Three times a week    Frequency of Social Gatherings with Friends and Family: Once a week    Attends Religious Services: 1 to 4 times per year    Active Member of [employer] or Organizations: No    Attends Engineer, structural: Not on file    Marital Status: Married  Catering manager Violence: Not on file   Review  of Systems No knee issues today     Objective:   Physical Exam Constitutional:      Appearance: Normal appearance.   Musculoskeletal:     Comments: Ecchymoses over distal right foot---with tenderness over end of 1st and 2nd metacarpals. Mild bruising in proximal great toe--with sig tenderness in DIP of toe Pain with any manipulation of toe   Neurological:     Mental Status: She is alert.     Comments: Sig antalgic gait           Assessment & Plan:

## 2023-12-06 NOTE — Telephone Encounter (Signed)
 FYI Only or Action Required?: FYI only for provider.  Patient was last seen in primary care on 09/22/2023 by Randeen Laine LABOR, MD. Called Nurse Triage reporting Toe Injury. Symptoms began yesterday. Interventions attempted: Nothing. Symptoms are: unchanged.  Triage Disposition: See Physician Within 24 Hours  Patient/caregiver understands and will follow disposition?: Yes   Copied from CRM [id number]. Topic: Clinical - Red Word Triage >> Dec 06, 2023  8:50 AM Meredith [name] wrote: Red Word that prompted transfer to Nurse Triage: Patient said she fell lastnight and either jammed or broke her big toe. This morning it's swollen and getting black, unable to put pressure. Reason for Disposition  [1] Toe injury AND [2] bad limp or can't wear shoes/sandals  Answer Assessment - Initial Assessment Questions 1. MECHANISM: How did the injury happen?      Fell and jammed her big toe on the right foot 2. ONSET: When did the injury happen? (Minutes or hours ago)      Last night 3. LOCATION: What part of the toe is injured? Is the nail damaged?      Big toe on right foot 4. APPEARANCE of TOE INJURY: What does the injury look like?      Bruising and unable to walk on it 5. SEVERITY: Can you use the foot normally? Can you walk?      Can't walk on it 6. SIZE: For cuts, bruises, or swelling, ask: How large is it? (e.g., inches or centimeters;  entire toe)      na 7. PAIN: Is there pain? If Yes, ask: How bad is the pain?   (e.g., Scale 1-10; or mild, moderate, severe)     moderate 8. TETANUS: For any breaks in the skin, ask: When was the last tetanus booster?     na 9. DIABETES: Do you have a history of diabetes or poor circulation in the feet?     denies 10. OTHER SYMPTOMS: Do you have any other symptoms?        throbbing 11. PREGNANCY: Is there any chance you are pregnant? When was your last menstrual period?       na  Protocols used: Toe Injury-A-AH

## 2023-12-06 NOTE — Assessment & Plan Note (Addendum)
 Twisting inury---findings suspicious for avulsion Will check x-ray---negative Discussed analgesics, intermittent ice, rest, etc If pain not improving, can see ortho or podiatrist

## 2023-12-06 NOTE — Telephone Encounter (Signed)
 Appt scheduled with Dr. Jimmy today. Will rout to him and PCP as a fyi

## 2023-12-26 ENCOUNTER — Ambulatory Visit: Payer: Self-pay | Admitting: Family Medicine

## 2023-12-26 ENCOUNTER — Other Ambulatory Visit (INDEPENDENT_AMBULATORY_CARE_PROVIDER_SITE_OTHER)

## 2023-12-26 DIAGNOSIS — E7800 Pure hypercholesterolemia, unspecified: Secondary | ICD-10-CM

## 2023-12-26 LAB — LIPID PANEL
Cholesterol: 254 mg/dL — ABNORMAL HIGH (ref 0–200)
HDL: 74 mg/dL (ref >39.00)
LDL Cholesterol: 147 mg/dL — ABNORMAL HIGH (ref 0–99)
NonHDL: 179.78
Total CHOL/HDL Ratio: 3
Triglycerides: 162 mg/dL — ABNORMAL HIGH (ref 0.0–149.0)
VLDL: 32.4 mg/dL (ref 0.0–40.0)

## 2023-12-28 ENCOUNTER — Encounter: Payer: Self-pay | Admitting: Family Medicine

## 2023-12-28 ENCOUNTER — Ambulatory Visit (INDEPENDENT_AMBULATORY_CARE_PROVIDER_SITE_OTHER): Admitting: Family Medicine

## 2023-12-28 VITALS — BP 118/82 | HR 66 | Temp 98.3°F | Ht 66.25 in | Wt 169.1 lb

## 2023-12-28 DIAGNOSIS — K219 Gastro-esophageal reflux disease without esophagitis: Secondary | ICD-10-CM

## 2023-12-28 DIAGNOSIS — R2231 Localized swelling, mass and lump, right upper limb: Secondary | ICD-10-CM

## 2023-12-28 DIAGNOSIS — R2230 Localized swelling, mass and lump, unspecified upper limb: Secondary | ICD-10-CM | POA: Insufficient documentation

## 2023-12-28 DIAGNOSIS — Z79899 Other long term (current) drug therapy: Secondary | ICD-10-CM | POA: Insufficient documentation

## 2023-12-28 DIAGNOSIS — E7800 Pure hypercholesterolemia, unspecified: Secondary | ICD-10-CM | POA: Diagnosis not present

## 2023-12-28 MED ORDER — PANTOPRAZOLE SODIUM 40 MG PO TBEC: 40.0000 mg | DELAYED_RELEASE_TABLET | Freq: Every day | ORAL | 3 refills | Status: AC

## 2023-12-28 NOTE — Assessment & Plan Note (Signed)
 Resolution of severe symptoms with protonix  40 mg daily and diet  Working to avoid triggers and loose weight   Given handout re: diet choices   Plan to continue 6-12 mo  May try to wean in future  Also watch vit levels

## 2023-12-28 NOTE — Assessment & Plan Note (Signed)
 Cholesterol is not optimal after diet effort   Disc goals for lipids and reasons to control them Rev last labs with pt Rev low sat fat diet in detail  LDL in 140s and good HDLASCVD risk is low , but pt had young heart dz/stroke in her mother  Will check cardiac ca score

## 2023-12-28 NOTE — Patient Instructions (Addendum)
  I put the referral in for the cardiac calcium scan  Please let us  know if you don't hear in 1-2 weeks to set that up   Avoid red meat/ fried foods/ egg yolks/ fatty breakfast meats/ butter, cheese and high fat dairy/ and shellfish   Stay on the generic protonix   -so glad it is working    The bump on your hand may be a ganglion cyst  If it gets bigger or becomes more bothersome let us  know and we will refer to a hand specialist    Call and schedule your mammogram   You have an order for:  [x]   3D Mammogram  []   Bone Density     Please call for appointment:   [x]   [facility]  [address] [place] [address]  [phone]  []   [facility])   [address]  [place], [place] [address]  [phone]  []   [facility]      [address] [place], [place]        [phone]         []   [facility]  [address] [place], [place]  [phone]  []  [facility]   [address]   [place], [place] [address]  [phone]  []  [facility]  [address] [place], [place] [address] [phone]    Make sure to wear two piece clothing  No lotions powders or deodorants the day of the appointment Make sure to bring picture ID and insurance card.  Bring list of medications you are currently taking including any supplements.   Schedule your screening mammogram through MyChart!   Select [place] imaging sites can now be scheduled through MyChart.  Log into your MyChart account.  Go to 'Visit' (or 'Appointments' if  on mobile App) --> Schedule an  Appointment  Under 'Select a Reason for Visit' choose the Mammogram  Screening option.  Complete the pre-visit questions  and select the time and place that  best fits  your schedule

## 2023-12-28 NOTE — Assessment & Plan Note (Signed)
 Started back on protoinx 40 mg daily  Discussed long term effects - renal/vit def  Will continue to monitor

## 2023-12-28 NOTE — Progress Notes (Signed)
 Subjective:    Patient ID: Meredith [name], female    DOB: 1961/02/09, 63 y.o.   MRN: [id number]  HPI  Wt Readings from Last 3 Encounters:  12/28/23 169 lb 2 oz (76.7 kg)  12/06/23 166 lb (75.3 kg)  09/22/23 164 lb 2 oz (74.4 kg)   27.09 kg/m  Vitals:   12/28/23 1606  BP: 118/82  Pulse: 66  Temp: 98.3 F (36.8 C)  SpO2: 99%    Here for follow up of  GERD Hyperlipidemia  Bump on palm of right hand   Last visit noted GERD symptoms were worse  Discussed dietary habits/avoidance of triggers and nsaids  Symptoms were severe    We prescription protonix  40 mg daily  Much much better  No heartburn Normal amt of belching  Throat is much better /no longer burning    Antacid - has not needed   Working in yard for exercise     Hyperlipidemia Lab Results  Component Value Date   CHOL 254 (H) 12/26/2023   CHOL 257 (H) 09/22/2023   CHOL 214 (H) 06/18/2022   Lab Results  Component Value Date   HDL 74.00 12/26/2023   HDL 84.50 09/22/2023   HDL 65.20 06/18/2022   Lab Results  Component Value Date   LDLCALC 147 (H) 12/26/2023   LDLCALC 151 (H) 09/22/2023   LDLCALC 136 (H) 06/18/2022   Lab Results  Component Value Date   TRIG 162.0 (H) 12/26/2023   TRIG 109.0 09/22/2023   TRIG 63.0 06/18/2022   Lab Results  Component Value Date   CHOLHDL 3 12/26/2023   CHOLHDL 3 09/22/2023   CHOLHDL 3 06/18/2022   Lab Results  Component Value Date   LDLDIRECT 140.4 03/01/2012   LDLDIRECT 133.2 01/07/2011   LDLDIRECT 142.0 09/19/2009    The 10-year ASCVD risk score (Arnett DK, et al., 2019) is: 3.4%   Values used to calculate the score:     Age: 12 years     Clincally relevant sex: Female     Is Non-Hispanic African American: No     Diabetic: No     Tobacco smoker: No     Systolic Blood Pressure: 118 mmHg     Is BP treated: No     HDL Cholesterol: 74 mg/dL     Total Cholesterol: 254 mg/dL  Not improved  Diet - was crazy because she had company  Eating  out out more than usual   When she makes the effort- cuts back on fried food   Beef- on and off  Mainly eats chicken  Less fatty pork   Some shrimp- not often however    Mom had MI and possibly stroke  At 50   Had ETT in 2012 -was normal        Patient Active Problem List   Diagnosis Date Noted   Current use of proton pump inhibitor 12/28/2023   Mass of hand 12/28/2023   Right foot pain 12/06/2023   Skin lesion 09/22/2023   Encounter for screening mammogram for breast cancer 06/16/2021   Benign positional vertigo 11/11/2020   Stress reaction 08/25/2020   Colon cancer screening 01/01/2020   Elevated glucose level 01/01/2020   Vertigo 05/17/2013   Lumbar degenerative disc disease 03/08/2012   Encounter for gynecological examination 01/12/2011   Routine general medical examination at a health care facility 01/03/2011   PLANTAR FASCIITIS, BILATERAL 08/28/2009   HYPERCHOLESTEROLEMIA 10/13/2006   Allergic rhinitis 10/13/2006   GERD 10/13/2006  IBS 10/13/2006   MIGRAINES, HX OF 10/13/2006   Past Medical History:  Diagnosis Date   Acquired keratoderma    Acute sinusitis, unspecified    Allergic rhinitis    Cancer (HCC)    skin   Dysmenorrhea    Esophagitis    GERD (gastroesophageal reflux disease)    h/o  not much now   History of migraine headaches    HLD (hyperlipidemia)    IBS (irritable bowel syndrome)    Obesity    Pain in joint, lower leg    Plantar fascial fibromatosis    Pure hypercholesterolemia    Ulcer    Past Surgical History:  Procedure Laterality Date   COLONOSCOPY     SHOULDER ARTHROSCOPY WITH OPEN ROTATOR CUFF REPAIR Right 11/27/2015   Procedure: SHOULDER ARTHROSCOPY WITH OPEN ROTATOR CUFF REPAIR;  Surgeon: Franky Cranker, MD;  Location: [facility] ORS;  Service: Orthopedics;  Laterality: Right;   treadmill stress test  01/13/11   Social History   Tobacco Use   Smoking status: Never   Smokeless tobacco: Never  Substance Use Topics   Alcohol  use: Yes    Comment: Occasional   Drug use: No   Family History  Problem Relation Age of Onset   Heart attack Mother    Hypertension Mother    Lung cancer Father        + smoker   GER disease Father        esophageal stricture   Cancer Father    Other Sister 62       MAC disease   Hypertension Brother    Melanoma Brother    No Known Allergies Current Outpatient Medications on File Prior to Visit  Medication Sig Dispense Refill   Meclizine  HCl 25 MG CHEW Chew 1 tablet (25 mg total) by mouth 3 (three) times daily as needed. 90 tablet 1   No current facility-administered medications on file prior to visit.    Review of Systems  Constitutional:  Negative for activity change, appetite change, fatigue, fever and unexpected weight change.  HENT:  Negative for congestion, ear pain, rhinorrhea, sinus pressure and sore throat.   Eyes:  Negative for pain, redness and visual disturbance.  Respiratory:  Negative for cough, shortness of breath and wheezing.   Cardiovascular:  Negative for chest pain and palpitations.  Gastrointestinal:  Negative for abdominal pain, blood in stool, constipation and diarrhea.  Endocrine: Negative for polydipsia and polyuria.  Genitourinary:  Negative for dysuria, frequency and urgency.  Musculoskeletal:  Negative for arthralgias, back pain and myalgias.       Lump on hand  Skin:  Negative for pallor and rash.  Allergic/Immunologic: Negative for environmental allergies.  Neurological:  Negative for dizziness, syncope and headaches.  Hematological:  Negative for adenopathy. Does not bruise/bleed easily.  Psychiatric/Behavioral:  Negative for decreased concentration and dysphoric mood. The patient is not nervous/anxious.        Objective:   Physical Exam Constitutional:      General: She is not in acute distress.    Appearance: Normal appearance. She is well-developed. She is not ill-appearing or diaphoretic.     Comments: Overweight   HENT:      Head: Normocephalic and atraumatic.  Eyes:     Conjunctiva/sclera: Conjunctivae normal.     Pupils: Pupils are equal, round, and reactive to light.  Neck:     Thyroid: No thyromegaly.     Vascular: No carotid bruit or JVD.  Cardiovascular:  Rate and Rhythm: Normal rate and regular rhythm.     Heart sounds: Normal heart sounds.     No gallop.  Pulmonary:     Effort: Pulmonary effort is normal. No respiratory distress.     Breath sounds: Normal breath sounds. No wheezing or rales.  Abdominal:     General: There is no distension or abdominal bruit.     Palpations: Abdomen is soft. There is no mass.     Tenderness: There is no abdominal tenderness. There is no guarding or rebound.  Musculoskeletal:     Cervical back: Normal range of motion and neck supple.     Right lower leg: No edema.     Left lower leg: No edema.     Comments: 2-3 mm round mobile lump right palm proximal to 3rd digit   Lymphadenopathy:     Cervical: No cervical adenopathy.  Skin:    General: Skin is warm and dry.     Coloration: Skin is not jaundiced or pale.     Findings: No rash.  Neurological:     Mental Status: She is alert.     Coordination: Coordination normal.     Deep Tendon Reflexes: Reflexes are normal and symmetric. Reflexes normal.  Psychiatric:        Mood and Affect: Mood normal.           Assessment & Plan:   Problem List Items Addressed This Visit       Digestive   GERD - Primary   Resolution of severe symptoms with protonix  40 mg daily and diet  Working to avoid triggers and loose weight   Given handout re: diet choices   Plan to continue 6-12 mo  May try to wean in future  Also watch vit levels       Relevant Medications   pantoprazole  (PROTONIX ) 40 MG tablet     Other   Mass of hand   3-4 mm round mobile knot in right palm of hand  Suspect ganglion cyst  May have grown Pt declines hand specialist referral at this time but may consider later and will let us  know    Will watch for future discomfort or growth       HYPERCHOLESTEROLEMIA   Cholesterol is not optimal after diet effort   Disc goals for lipids and reasons to control them Rev last labs with pt Rev low sat fat diet in detail  LDL in 140s and good HDLASCVD risk is low , but pt had young heart dz/stroke in her mother  Will check cardiac ca score           Relevant Orders   CT CARDIAC SCORING (SELF PAY ONLY)   Current use of proton pump inhibitor   Started back on protoinx 40 mg daily  Discussed long term effects - renal/vit def  Will continue to monitor

## 2023-12-28 NOTE — Assessment & Plan Note (Signed)
 3-4 mm round mobile knot in right palm of hand  Suspect ganglion cyst  May have grown Pt declines hand specialist referral at this time but may consider later and will let us  know   Will watch for future discomfort or growth

## 2024-01-11 ENCOUNTER — Encounter: Payer: Self-pay | Admitting: *Deleted

## 2024-02-22 ENCOUNTER — Ambulatory Visit: Payer: Self-pay

## 2024-02-22 NOTE — Telephone Encounter (Signed)
Noted, will see

## 2024-02-22 NOTE — Telephone Encounter (Signed)
 Copied from CRM [id number]. Topic: Clinical - Red Word Triage >> Feb 22, 2024  8:07 AM Meredith [name] wrote: Red Word that prompted transfer to Nurse Triage: cut her right leg about two weeks ago and she thinks it is getting infected >> Feb 22, 2024 10:52 AM Burnard DEL wrote: Patient returned call to Meredith [name].I relayed message regarding the prper care of her wound by Dr Randeen.  Patient verbalized understanding.

## 2024-02-22 NOTE — Telephone Encounter (Signed)
 FYI Only or Action Required?: Action required by provider: request for appointment.  Patient was last seen in primary care on 12/28/2023 by Randeen Laine LABOR, MD.  Called Nurse Triage reporting Laceration.  Symptoms began a week ago.  Interventions attempted: OTC medications: peroxide and neosporin  .  Symptoms are: gradually worsening.  Triage Disposition: See PCP When Office is Open (Within 3 Days)  Patient/caregiver understands and will follow disposition?: YesCopied from CRM [id number]. Topic: Clinical - Red Word Triage >> Feb 22, 2024  8:07 AM Rosina BIRCH wrote: Red Word that prompted transfer to Nurse Triage: cut her right leg about two weeks ago and she thinks it is getting infected Reason for Disposition  [1] Minor cut or scratch AND [2] from self-injury (e.g., cutting, self-harm) AND [3] stable (i.e., not suicidal, not out of control)  Answer Assessment - Initial Assessment Questions Peroxide, neosporin , and bandage daily since 8/31. Pt has two cuts near each other. Last 2 days the soreness is increasing. No puss. No drainage. Warm to touch.     1. APPEARANCE of INJURY: What does the injury look like?      Red around cut and warm to touch  2. ONSET: How long ago did the injury occur?      8/31 3. LOCATION: Where is the injury located?      Right leg 4. SIZE: How large is the cut?      1 wide on both 5. BLEEDING: Is it bleeding now? If Yes, ask: Is it difficult to stop?      denies 6. PAIN: Is there any pain? If Yes, ask: How bad is the pain? (Scale 0-10; or none, mild, moderate, severe)     2 7. MECHANISM: Tell me how it happened.      Opend freezer and ice pack sliced leg as it fell.  Protocols used: Cuts and Lacerations-A-AH

## 2024-02-22 NOTE — Telephone Encounter (Signed)
 Left message for patient to give office a call back to discuss recommendations from provider.  OK for E2C2 to relay note if pt calls back. If relayed, please notify the office.

## 2024-02-22 NOTE — Telephone Encounter (Signed)
 Aware, will watch for correspondence Thanks to Dr Avelina for seeing her tomorrow   In meantime please instructed pt to stop using peroxide as it can delay healing Clean with soap and water instead

## 2024-02-23 ENCOUNTER — Ambulatory Visit (INDEPENDENT_AMBULATORY_CARE_PROVIDER_SITE_OTHER): Admitting: Family Medicine

## 2024-02-23 ENCOUNTER — Encounter: Payer: Self-pay | Admitting: Family Medicine

## 2024-02-23 VITALS — BP 132/70 | HR 67 | Temp 97.5°F | Resp 20 | Ht 66.25 in | Wt 167.1 lb

## 2024-02-23 DIAGNOSIS — L03115 Cellulitis of right lower limb: Secondary | ICD-10-CM | POA: Insufficient documentation

## 2024-02-23 MED ORDER — CEPHALEXIN 250 MG/5ML PO SUSR: 500.0000 mg | Freq: Three times a day (TID) | ORAL | 0 refills | Status: AC

## 2024-02-23 NOTE — Assessment & Plan Note (Signed)
 Acute, concern for possible cellulitis at site of wound.  Otherwise wound healing well. Will treat with Keflex  500 mg 3 times daily x 7 days.  Patient cannot swallow pills so we will send in suspension.  If this is to expensive for her we can consider trying topical mupirocin instead versus other suspension.  Recommended following erythema by drawing a line around cellulitis.  Return and ER precautions provided.

## 2024-02-23 NOTE — Progress Notes (Signed)
 Patient ID: Meredith [name], female    DOB: 05/20/1961, 63 y.o.   MRN: [id number]  This visit was conducted in person.  BP 132/70   Pulse 67   Temp (!) 97.5 F (36.4 C)   Resp 20   Ht 5' 6.25 (1.683 m)   Wt 167 lb 2 oz (75.8 kg)   LMP 06/15/2015 (Approximate)   SpO2 99%   BMI 26.77 kg/m    CC:  Chief Complaint  Patient presents with   Laceration    Shin Happened Aug 24     Subjective:   HPI: Meredith [name] is a 63 y.o. female patient of Dr. Randeen presenting on 02/23/2024 for Laceration [place] Happened Aug 24 )  Patient reports accidental laceration of lower right leg on August 24 (2 weeks ago)  Cut occurred when opening freezer, ice pack fell on shin. Sliced leg, flap of skin remained. She has been washing with warm soapy water, treating with peroxide, Neosporin  and daily bandage. She has noted increasing soreness,   new redness around skin lesion in last 2 days.  No discharge.  No associated swelling.  No fever, no flu like like.   No history of immunocompromisation or diabetes.     She has trouble swallowing pills capsules especially.  Relevant past medical, surgical, family and social history reviewed and updated as indicated. Interim medical history since our last visit reviewed. Allergies and medications reviewed and updated. Outpatient Medications Prior to Visit  Medication Sig Dispense Refill   Meclizine  HCl 25 MG CHEW Chew 1 tablet (25 mg total) by mouth 3 (three) times daily as needed. 90 tablet 1   pantoprazole  (PROTONIX ) 40 MG tablet Take 1 tablet (40 mg total) by mouth daily. 90 tablet 3   No facility-administered medications prior to visit.     Per HPI unless specifically indicated in ROS section below Review of Systems  Constitutional:  Negative for fatigue and fever.  HENT:  Negative for congestion.   Eyes:  Negative for pain.  Respiratory:  Negative for cough and shortness of breath.   Cardiovascular:  Negative for chest pain,  palpitations and leg swelling.  Gastrointestinal:  Negative for abdominal pain.  Genitourinary:  Negative for dysuria and vaginal bleeding.  Musculoskeletal:  Negative for back pain.  Skin:  Positive for rash.  Neurological:  Negative for syncope, light-headedness and headaches.  Psychiatric/Behavioral:  Negative for dysphoric mood.    Objective:  BP 132/70   Pulse 67   Temp (!) 97.5 F (36.4 C)   Resp 20   Ht 5' 6.25 (1.683 m)   Wt 167 lb 2 oz (75.8 kg)   LMP 06/15/2015 (Approximate)   SpO2 99%   BMI 26.77 kg/m   Wt Readings from Last 3 Encounters:  02/23/24 167 lb 2 oz (75.8 kg)  12/28/23 169 lb 2 oz (76.7 kg)  12/06/23 166 lb (75.3 kg)      Physical Exam Constitutional:      General: She is not in acute distress.    Appearance: Normal appearance. She is well-developed. She is not ill-appearing or toxic-appearing.  HENT:     Head: Normocephalic.     Right Ear: Hearing, tympanic membrane, ear canal and external ear normal. Tympanic membrane is not erythematous, retracted or bulging.     Left Ear: Hearing, tympanic membrane, ear canal and external ear normal. Tympanic membrane is not erythematous, retracted or bulging.     Nose: No mucosal edema or rhinorrhea.  Right Sinus: No maxillary sinus tenderness or frontal sinus tenderness.     Left Sinus: No maxillary sinus tenderness or frontal sinus tenderness.     Mouth/Throat:     Pharynx: Uvula midline.  Eyes:     General: Lids are normal. Lids are everted, no foreign bodies appreciated.     Conjunctiva/sclera: Conjunctivae normal.     Pupils: Pupils are equal, round, and reactive to light.  Neck:     Thyroid: No thyroid mass or thyromegaly.     Vascular: No carotid bruit.     Trachea: Trachea normal.  Cardiovascular:     Rate and Rhythm: Normal rate and regular rhythm.     Pulses: Normal pulses.     Heart sounds: Normal heart sounds, S1 normal and S2 normal. No murmur heard.    No friction rub. No gallop.   Pulmonary:     Effort: Pulmonary effort is normal. No tachypnea or respiratory distress.     Breath sounds: Normal breath sounds. No decreased breath sounds, wheezing, rhonchi or rales.  Abdominal:     General: Bowel sounds are normal.     Palpations: Abdomen is soft.     Tenderness: There is no abdominal tenderness.  Musculoskeletal:     Cervical back: Normal range of motion and neck supple.  Skin:    General: Skin is warm and dry.     Findings: Wound present. No rash.         Comments: See photo  Neurological:     Mental Status: She is alert.  Psychiatric:        Mood and Affect: Mood is not anxious or depressed.        Speech: Speech normal.        Behavior: Behavior normal. Behavior is cooperative.        Thought Content: Thought content normal.        Judgment: Judgment normal.       Results for orders placed or performed in visit on 12/26/23  Lipid panel   Collection Time: 12/26/23  7:58 AM  Result Value Ref Range   Cholesterol 254 (H) 0 - 200 mg/dL   Triglycerides 837.9 (H) 0.0 - 149.0 mg/dL   HDL 25.99 >60.99 mg/dL   VLDL 67.5 0.0 - 59.9 mg/dL   LDL Cholesterol 852 (H) 0 - 99 mg/dL   Total CHOL/HDL Ratio 3    NonHDL 179.78     Assessment and Plan  Cellulitis of right lower extremity Assessment & Plan: Acute, concern for possible cellulitis at site of wound.  Otherwise wound healing well. Will treat with Keflex  500 mg 3 times daily x 7 days.  Patient cannot swallow pills so we will send in suspension.  If this is to expensive for her we can consider trying topical mupirocin instead versus other suspension.  Recommended following erythema by drawing a line around cellulitis.  Return and ER precautions provided.   Other orders -     Cephalexin ; Take 10 mLs (500 mg total) by mouth 3 (three) times daily for 7 days.  Dispense: 210 mL; Refill: 0    No follow-ups on file.   Greig Ring, MD

## 2024-03-26 ENCOUNTER — Ambulatory Visit
Admission: RE | Admit: 2024-03-26 | Discharge: 2024-03-26 | Disposition: A | Discharge status: Home / Self Care | Source: Ambulatory Visit | Attending: Family Medicine | Admitting: Family Medicine

## 2024-03-26 DIAGNOSIS — Z1231 Encounter for screening mammogram for malignant neoplasm of breast: Secondary | ICD-10-CM | POA: Insufficient documentation
